# Patient Record
Sex: Male | Born: 1935 | Race: White | Hispanic: No | Marital: Married | Smoking: Former smoker
Health system: Southern US, Community
[De-identification: ages and names within clinical notes are randomized; demographics above are authoritative.]

## PROBLEM LIST (undated history)

## (undated) DIAGNOSIS — N400 Enlarged prostate without lower urinary tract symptoms: Secondary | ICD-10-CM

## (undated) DIAGNOSIS — I10 Essential (primary) hypertension: Secondary | ICD-10-CM

## (undated) DIAGNOSIS — F0390 Unspecified dementia without behavioral disturbance: Secondary | ICD-10-CM

## (undated) DIAGNOSIS — F32A Depression, unspecified: Secondary | ICD-10-CM

## (undated) DIAGNOSIS — F329 Major depressive disorder, single episode, unspecified: Secondary | ICD-10-CM

## (undated) DIAGNOSIS — E119 Type 2 diabetes mellitus without complications: Secondary | ICD-10-CM

---

## 1989-06-08 HISTORY — PX: CORONARY ARTERY BYPASS GRAFT: SHX141

## 2012-03-05 ENCOUNTER — Encounter (HOSPITAL_COMMUNITY): Payer: Self-pay | Admitting: Emergency Medicine

## 2012-03-05 ENCOUNTER — Emergency Department (HOSPITAL_COMMUNITY): Payer: Medicare Other

## 2012-03-05 ENCOUNTER — Emergency Department (HOSPITAL_COMMUNITY)
Admission: EM | Admit: 2012-03-05 | Discharge: 2012-03-05 | Disposition: A | Discharge status: Home / Self Care | Payer: Medicare Other | Attending: Emergency Medicine | Admitting: Emergency Medicine

## 2012-03-05 DIAGNOSIS — W19XXXA Unspecified fall, initial encounter: Secondary | ICD-10-CM

## 2012-03-05 DIAGNOSIS — F0390 Unspecified dementia without behavioral disturbance: Secondary | ICD-10-CM | POA: Insufficient documentation

## 2012-03-05 DIAGNOSIS — G319 Degenerative disease of nervous system, unspecified: Secondary | ICD-10-CM | POA: Insufficient documentation

## 2012-03-05 DIAGNOSIS — T148XXA Other injury of unspecified body region, initial encounter: Secondary | ICD-10-CM

## 2012-03-05 DIAGNOSIS — M25559 Pain in unspecified hip: Secondary | ICD-10-CM | POA: Insufficient documentation

## 2012-03-05 DIAGNOSIS — W010XXA Fall on same level from slipping, tripping and stumbling without subsequent striking against object, initial encounter: Secondary | ICD-10-CM | POA: Insufficient documentation

## 2012-03-05 DIAGNOSIS — Y9210 Unspecified residential institution as the place of occurrence of the external cause: Secondary | ICD-10-CM | POA: Insufficient documentation

## 2012-03-05 NOTE — ED Notes (Signed)
Pt arrived with EMS with and IV and I removed it before moving patient into [facility]

## 2012-03-05 NOTE — ED Notes (Signed)
Pt returned from CT °

## 2012-03-05 NOTE — ED Notes (Signed)
MD at bedside and removed pt off long spine board

## 2012-03-05 NOTE — ED Notes (Signed)
Pt is resident at [facility] at [place]. Pt was in dinning room getting breakfast when pt fell with all his weight on left hip. Pt has HX of dementia.

## 2012-03-05 NOTE — ED Notes (Signed)
Called report to [facility] at [facility]. Phoned ptar for transportation back to [facility]

## 2012-03-05 NOTE — ED Notes (Signed)
ptar is at bedside to transport pt to [facility]

## 2012-03-05 NOTE — ED Notes (Signed)
WUJ:[id number]<JY> Expected date:03/05/12<BR> Expected time: 9:38 AM<BR> Means of arrival:Ambulance<BR> Comments:<BR> Fall, hip rotation

## 2012-03-05 NOTE — ED Provider Notes (Signed)
History     CSN: [id number]  Arrival date & time 03/05/12  1004   First MD Initiated Contact with Patient 03/05/12 1007      Chief Complaint  Patient presents with  . Fall    (Consider location/radiation/quality/duration/timing/severity/associated sxs/prior treatment) Patient is a 78 y.o. male presenting with fall. The history is provided by the patient and the EMS personnel. The history is limited by the condition of the patient.  Fall  pt with hx dementia, very confused at baseline per report - level 5 caveat. Pt at [facility], was walking to breakfast, tripped, fell forward ?onto left hip, ? Hi head. No noted loc. ems was called. ems notes initial left hip pain, although states pt not able to verbalize pain. Was conscious and alert on their arrival and has remained alert, content, w mental status described as being consistent with baseline. No nv. Pt denies pain, although difficult to assess given advanced dementia/confusion at baseline.     No past medical history on file.  No past surgical history on file.  No family history on file.  History  Substance Use Topics  . Smoking status: Not on file  . Smokeless tobacco: Not on file  . Alcohol Use: Not on file      Review of Systems  Unable to perform ROS: Dementia  level 5 caveat  Allergies  Review of patient's allergies indicates not on file.  Home Medications  No current outpatient prescriptions on file.  BP 137/72  Pulse 63  Temp 97.3 F (36.3 C) (Axillary)  Resp 18  SpO2 95%  Physical Exam  Nursing note and vitals reviewed. Constitutional: He appears well-developed and well-nourished. No distress.  HENT:       Contusion anterior scalp.   Eyes: Pupils are equal, round, and reactive to light.  Neck: Neck supple. No tracheal deviation present.       ccollar  Cardiovascular: Normal rate, regular rhythm, normal heart sounds and intact distal pulses.   Pulmonary/Chest: Effort normal and breath sounds normal. No  accessory muscle usage. No respiratory distress. He exhibits no tenderness.  Abdominal: Soft. He exhibits no distension. There is no tenderness.  Musculoskeletal: Normal range of motion.       CTLS spine, non tender, aligned, no step off. ?tenderness left hip. Good rom, no gross shortening or rotation noted. Distal pulses palp bil.    Neurological: He is alert.       Awake and alert, confused. Moves bil extremities purposefully, non compliant w exam.   Skin: Skin is warm and dry.    ED Course  Procedures (including critical care time)  No results found for this or any previous visit. Dg Hip Complete Left  03/05/2012  *RADIOLOGY REPORT*  Clinical Data: Left hip pain.  Unable to give further history regarding trauma.  LEFT HIP - COMPLETE 2+ VIEW  Comparison: None  Findings: No acute fracture or dislocation.  Vascular calcifications. Sacroiliac joints are symmetric.  IMPRESSION: No acute osseous abnormality.   Original Report Authenticated By: Consuello Bossier, M.D.    Ct Head Wo Contrast  03/05/2012  *RADIOLOGY REPORT*  Clinical Data:  Fall  CT HEAD WITHOUT CONTRAST CT CERVICAL SPINE WITHOUT CONTRAST  Technique:  Multidetector CT imaging of the head and cervical spine was performed following the standard protocol without intravenous contrast.  Multiplanar CT image reconstructions of the cervical spine were also generated.  Comparison:  None.  CT HEAD  Findings: No evidence of parenchymal hemorrhage or extra-axial fluid  collection. No mass lesion, mass effect, or midline shift.  No CT evidence of acute infarction.  Subcortical white matter and periventricular small vessel ischemic changes.  Intracranial atherosclerosis.  Global cortical and central atrophy.  Secondary ventriculomegaly.  The visualized paranasal sinuses are essentially clear. The mastoid air cells are unopacified.  No evidence of calvarial fracture.  IMPRESSION: No evidence of acute intracranial abnormality.  Atrophy with secondary  ventriculomegaly.  Small vessel ischemic changes and intracranial atherosclerosis.  CT CERVICAL SPINE  Findings: Reversal of the normal cervical lordosis.  No evidence of fracture or dislocation.  Vertebral body heights are maintained.  The dens appears intact.  No prevertebral soft tissue swelling.  Mild to moderate degenerative changes, most prominent at C5-6.  Visualized thyroid is unremarkable.  Visualized lung apices are clear.  IMPRESSION: No evidence of traumatic injury to the cervical spine.  Mild to moderate degenerative changes.   Original Report Authenticated By: Charline Bills, M.D.    Ct Cervical Spine Wo Contrast  03/05/2012  *RADIOLOGY REPORT*  Clinical Data:  Fall  CT HEAD WITHOUT CONTRAST CT CERVICAL SPINE WITHOUT CONTRAST  Technique:  Multidetector CT imaging of the head and cervical spine was performed following the standard protocol without intravenous contrast.  Multiplanar CT image reconstructions of the cervical spine were also generated.  Comparison:  None.  CT HEAD  Findings: No evidence of parenchymal hemorrhage or extra-axial fluid collection. No mass lesion, mass effect, or midline shift.  No CT evidence of acute infarction.  Subcortical white matter and periventricular small vessel ischemic changes.  Intracranial atherosclerosis.  Global cortical and central atrophy.  Secondary ventriculomegaly.  The visualized paranasal sinuses are essentially clear. The mastoid air cells are unopacified.  No evidence of calvarial fracture.  IMPRESSION: No evidence of acute intracranial abnormality.  Atrophy with secondary ventriculomegaly.  Small vessel ischemic changes and intracranial atherosclerosis.  CT CERVICAL SPINE  Findings: Reversal of the normal cervical lordosis.  No evidence of fracture or dislocation.  Vertebral body heights are maintained.  The dens appears intact.  No prevertebral soft tissue swelling.  Mild to moderate degenerative changes, most prominent at C5-6.  Visualized  thyroid is unremarkable.  Visualized lung apices are clear.  IMPRESSION: No evidence of traumatic injury to the cervical spine.  Mild to moderate degenerative changes.   Original Report Authenticated By: Charline Bills, M.D.        MDM  Xrays.   Reviewed nursing notes and prior charts for additional history.    Recheck pt content, alert, comfortable. Good rom bil extremities without pain or focal bony tenderness.          Suzi Roots, MD 03/05/12 1210

## 2012-03-06 ENCOUNTER — Encounter (HOSPITAL_COMMUNITY): Payer: Self-pay | Admitting: Emergency Medicine

## 2012-03-06 ENCOUNTER — Emergency Department (HOSPITAL_COMMUNITY)
Admission: EM | Admit: 2012-03-06 | Discharge: 2012-03-06 | Disposition: A | Discharge status: Home / Self Care | Payer: Medicare Other | Attending: Emergency Medicine | Admitting: Emergency Medicine

## 2012-03-06 DIAGNOSIS — Z043 Encounter for examination and observation following other accident: Secondary | ICD-10-CM | POA: Insufficient documentation

## 2012-03-06 DIAGNOSIS — W19XXXA Unspecified fall, initial encounter: Secondary | ICD-10-CM

## 2012-03-06 DIAGNOSIS — F0390 Unspecified dementia without behavioral disturbance: Secondary | ICD-10-CM | POA: Insufficient documentation

## 2012-03-06 LAB — URINALYSIS, ROUTINE W REFLEX MICROSCOPIC
Nitrite: NEGATIVE
Protein, ur: NEGATIVE mg/dL
Specific Gravity, Urine: 1.027 (ref 1.005–1.030)
Urobilinogen, UA: 1 mg/dL (ref 0.0–1.0)

## 2012-03-06 LAB — BASIC METABOLIC PANEL
GFR calc Af Amer: >90 mL/min (ref >90)
GFR calc non Af Amer: 80 mL/min — ABNORMAL LOW (ref >90)
Potassium: 3.6 mEq/L (ref 3.5–5.1)
Sodium: 141 mEq/L (ref 135–145)

## 2012-03-06 LAB — URINE MICROSCOPIC-ADD ON

## 2012-03-06 NOTE — ED Provider Notes (Signed)
History     CSN: [id number]  Arrival date & time 03/06/12  0830   First MD Initiated Contact with Patient 03/06/12 [phone]      Chief Complaint  Patient presents with  . Fall    (Consider location/radiation/quality/duration/timing/severity/associated sxs/prior treatment) Patient is a 78 y.o. male presenting with fall. The history is provided by medical records and the nursing home.  Fall  PT  Has profound dementia. Lives in SNF.  Sent here for eval after fall. Seen in ed yesterday for same by dr. Arizona Constable.  Ct head, neck neg.  xr of hip neg.  No other hx available.  Level 5 caveat for profound dementia.  History reviewed. No pertinent past medical history.  History reviewed. No pertinent past surgical history.  No family history on file.  History  Substance Use Topics  . Smoking status: Not on file  . Smokeless tobacco: Not on file  . Alcohol Use: Not on file      Review of Systems  Unable to perform ROS: Dementia    Allergies  Review of patient's allergies indicates no known allergies.  Home Medications   Current Outpatient Rx  Name Route Sig Dispense Refill  . ASPIRIN EC 81 MG PO TBEC Oral Take 81 mg by mouth daily.    Marland Kitchen VITAMIN D 1000 UNITS PO TABS Oral Take 1,000 Units by mouth daily.    Marland Kitchen CLONAZEPAM 0.5 MG PO TABS Oral Take 0.5 mg by mouth daily.    Marland Kitchen DIVALPROEX SODIUM 125 MG PO CPSP Oral Take 375 mg by mouth 2 (two) times daily.    Marland Kitchen DOCUSATE SODIUM 100 MG PO CAPS Oral Take 100 mg by mouth daily.    Marland Kitchen FINASTERIDE 5 MG PO TABS Oral Take 5 mg by mouth daily.    . FUROSEMIDE 20 MG PO TABS Oral Take 20 mg by mouth daily.    . INSULIN ASPART 100 UNIT/ML [place] SOLN Subcutaneous Inject 2-10 Units into the skin 3 (three) times daily before meals.    . INSULIN DETEMIR 100 UNIT/ML [place] SOLN Subcutaneous Inject 35 Units into the skin at bedtime. Hold if CBG < 120.    Marland Kitchen INSULIN LISPRO (HUMAN) 100 UNIT/ML  SOLN Subcutaneous Inject 5 Units into the skin 3 (three) times daily  before meals.    Marland Kitchen LORAZEPAM 1 MG PO TABS Oral Take 2 mg by mouth every 6 (six) hours as needed. For anxiety.    Marland Kitchen METOPROLOL TARTRATE 50 MG PO TABS Oral Take 50 mg by mouth 2 (two) times daily.    . ADULT MULTIVITAMIN W/MINERALS CH Oral Take 1 tablet by mouth daily.    Marland Kitchen PRAVASTATIN SODIUM 40 MG PO TABS Oral Take 40 mg by mouth daily.    . QUETIAPINE FUMARATE 100 MG PO TABS Oral Take 100 mg by mouth daily.    . QUETIAPINE FUMARATE 50 MG PO TABS Oral Take 50 mg by mouth 2 (two) times daily.    Marland Kitchen RIVASTIGMINE 4.6 MG/24HR TD PT24 Transdermal Place 1 patch onto the skin daily.    Marland Kitchen TAMSULOSIN HCL 0.4 MG PO CAPS Oral Take 0.4 mg by mouth daily.    . TRAZODONE HCL 50 MG PO TABS Oral Take 25 mg by mouth at bedtime.    Marland Kitchen VITAMIN B-12 1000 MCG PO TABS Oral Take 1,000 mcg by mouth daily.      BP 103/69  Pulse 65  Temp 99 F (37.2 C) (Oral)  Resp 14  SpO2 97%  Physical  Exam  Vitals reviewed. Constitutional: He appears well-developed and well-nourished. No distress.  HENT:  Head: Normocephalic and atraumatic.  Eyes: Conjunctivae normal are normal.  Neck: Normal range of motion. Neck supple. No tracheal deviation present.       No neck ttp  Cardiovascular: Normal rate, regular rhythm and intact distal pulses.   No murmur heard. Pulmonary/Chest: Effort normal and breath sounds normal. He exhibits no tenderness.  Abdominal: Soft. Bowel sounds are normal. There is no tenderness.  Musculoskeletal: Normal range of motion. He exhibits no edema and no tenderness.       No deformities. rom does not cause pain.  No bruises.   Neurological: He is alert.  Skin: Skin is warm and dry. No erythema.    ED Course  Procedures (including critical care time)  Labs Reviewed  BASIC METABOLIC PANEL - Abnormal; Notable for the following:    Glucose, Bld 151 (*)     Calcium 8.3 (*)     GFR calc non Af Amer 80 (*)     All other components within normal limits  URINALYSIS, ROUTINE W REFLEX MICROSCOPIC    Dg Hip Complete Left  03/05/2012  *RADIOLOGY REPORT*  Clinical Data: Left hip pain.  Unable to give further history regarding trauma.  LEFT HIP - COMPLETE 2+ VIEW  Comparison: None  Findings: No acute fracture or dislocation.  Vascular calcifications. Sacroiliac joints are symmetric.  IMPRESSION: No acute osseous abnormality.   Original Report Authenticated By: Consuello Bossier, M.D.    Ct Head Wo Contrast  03/05/2012  *RADIOLOGY REPORT*  Clinical Data:  Fall  CT HEAD WITHOUT CONTRAST CT CERVICAL SPINE WITHOUT CONTRAST  Technique:  Multidetector CT imaging of the head and cervical spine was performed following the standard protocol without intravenous contrast.  Multiplanar CT image reconstructions of the cervical spine were also generated.  Comparison:  None.  CT HEAD  Findings: No evidence of parenchymal hemorrhage or extra-axial fluid collection. No mass lesion, mass effect, or midline shift.  No CT evidence of acute infarction.  Subcortical white matter and periventricular small vessel ischemic changes.  Intracranial atherosclerosis.  Global cortical and central atrophy.  Secondary ventriculomegaly.  The visualized paranasal sinuses are essentially clear. The mastoid air cells are unopacified.  No evidence of calvarial fracture.  IMPRESSION: No evidence of acute intracranial abnormality.  Atrophy with secondary ventriculomegaly.  Small vessel ischemic changes and intracranial atherosclerosis.  CT CERVICAL SPINE  Findings: Reversal of the normal cervical lordosis.  No evidence of fracture or dislocation.  Vertebral body heights are maintained.  The dens appears intact.  No prevertebral soft tissue swelling.  Mild to moderate degenerative changes, most prominent at C5-6.  Visualized thyroid is unremarkable.  Visualized lung apices are clear.  IMPRESSION: No evidence of traumatic injury to the cervical spine.  Mild to moderate degenerative changes.   Original Report Authenticated By: Charline Bills, M.D.     Ct Cervical Spine Wo Contrast  03/05/2012  *RADIOLOGY REPORT*  Clinical Data:  Fall  CT HEAD WITHOUT CONTRAST CT CERVICAL SPINE WITHOUT CONTRAST  Technique:  Multidetector CT imaging of the head and cervical spine was performed following the standard protocol without intravenous contrast.  Multiplanar CT image reconstructions of the cervical spine were also generated.  Comparison:  None.  CT HEAD  Findings: No evidence of parenchymal hemorrhage or extra-axial fluid collection. No mass lesion, mass effect, or midline shift.  No CT evidence of acute infarction.  Subcortical white matter and periventricular small vessel  ischemic changes.  Intracranial atherosclerosis.  Global cortical and central atrophy.  Secondary ventriculomegaly.  The visualized paranasal sinuses are essentially clear. The mastoid air cells are unopacified.  No evidence of calvarial fracture.  IMPRESSION: No evidence of acute intracranial abnormality.  Atrophy with secondary ventriculomegaly.  Small vessel ischemic changes and intracranial atherosclerosis.  CT CERVICAL SPINE  Findings: Reversal of the normal cervical lordosis.  No evidence of fracture or dislocation.  Vertebral body heights are maintained.  The dens appears intact.  No prevertebral soft tissue swelling.  Mild to moderate degenerative changes, most prominent at C5-6.  Visualized thyroid is unremarkable.  Visualized lung apices are clear.  IMPRESSION: No evidence of traumatic injury to the cervical spine.  Mild to moderate degenerative changes.   Original Report Authenticated By: Charline Bills, M.D.      No diagnosis found.    MDM  Dementia Reported fall. NO EVIDENCE OF INJURY        Cheri Guppy, MD 03/06/12 1021

## 2012-03-06 NOTE — ED Notes (Signed)
UJW:[id number]<YN> Expected date:03/06/12<BR> Expected time: 8:30 AM<BR> Means of arrival:Ambulance<BR> Comments:<BR> Dementia, rolled out of bed

## 2012-03-06 NOTE — ED Notes (Addendum)
Pt in via [facility]. Per EMS, pt rolled out of bed, Pt was here yesterday for the same.Pt sent per nursing facility policy for fall check. Pt verbalizes pain when moved in bed. Pt alert but not oriented to self, time and place.

## 2012-03-07 ENCOUNTER — Emergency Department (HOSPITAL_COMMUNITY)
Admission: EM | Admit: 2012-03-07 | Discharge: 2012-03-07 | Disposition: A | Discharge status: Skilled Nursing Facility | Payer: Medicare Other | Attending: Emergency Medicine | Admitting: Emergency Medicine

## 2012-03-07 DIAGNOSIS — R4701 Aphasia: Secondary | ICD-10-CM | POA: Insufficient documentation

## 2012-03-07 DIAGNOSIS — Z794 Long term (current) use of insulin: Secondary | ICD-10-CM | POA: Insufficient documentation

## 2012-03-07 DIAGNOSIS — F0390 Unspecified dementia without behavioral disturbance: Secondary | ICD-10-CM | POA: Insufficient documentation

## 2012-03-07 DIAGNOSIS — Z993 Dependence on wheelchair: Secondary | ICD-10-CM | POA: Insufficient documentation

## 2012-03-07 DIAGNOSIS — S0003XA Contusion of scalp, initial encounter: Secondary | ICD-10-CM | POA: Insufficient documentation

## 2012-03-07 DIAGNOSIS — W19XXXA Unspecified fall, initial encounter: Secondary | ICD-10-CM

## 2012-03-07 DIAGNOSIS — W050XXA Fall from non-moving wheelchair, initial encounter: Secondary | ICD-10-CM | POA: Insufficient documentation

## 2012-03-07 NOTE — ED Notes (Signed)
PTAR called  

## 2012-03-07 NOTE — ED Notes (Signed)
Received pt in room 25 , pt here from [facility] nursing facility where staff reported he fell out of the wheelchair. Per EMS this is his 5th floor in the last 48 hours. Pt was supposed to get a helmet at nursing facility, however he fell prior to receiving one. Pt is only responding to touch with grunting noises. Per EMS pt 's baseline mental status is unknown due to pt being on numerous psych. Medications. Pt has abrasion on his forehead and small abrasion on his right knee.

## 2012-03-07 NOTE — ED Provider Notes (Addendum)
History     CSN: [id number]  Arrival date & time 03/07/12  1548   First MD Initiated Contact with Patient 03/07/12 1658      Chief Complaint  Patient presents with  . Fall     HPI This elderly male presents for the third time in 3 days following a fall.  The patient has baseline significant dementia, is aphasic, nonambulatory.  Today, the patient slipped from his wheelchair, and was brought here for evaluation.  No reports of loss of consciousness, vomiting, behavioral changes, no notable other events following a fall. The patient was scheduled to receive a helmet due to his high fall risk.  He had not yet received the home but today when he fell.  Level V caveat, dementia No past medical history on file.  No past surgical history on file.  No family history on file.  History  Substance Use Topics  . Smoking status: Not on file  . Smokeless tobacco: Not on file  . Alcohol Use: Not on file      Review of Systems  Unable to perform ROS: Dementia    Allergies  Review of patient's allergies indicates no known allergies.  Home Medications   Current Outpatient Rx  Name Route Sig Dispense Refill  . ASPIRIN EC 81 MG PO TBEC Oral Take 81 mg by mouth daily.    Marland Kitchen VITAMIN D 1000 UNITS PO TABS Oral Take 1,000 Units by mouth daily.    Marland Kitchen CLONAZEPAM 0.5 MG PO TABS Oral Take 0.5 mg by mouth daily.    Marland Kitchen DIVALPROEX SODIUM 125 MG PO CPSP Oral Take 375 mg by mouth 2 (two) times daily.    Marland Kitchen DOCUSATE SODIUM 100 MG PO CAPS Oral Take 100 mg by mouth daily.    Marland Kitchen FINASTERIDE 5 MG PO TABS Oral Take 5 mg by mouth daily.    . FUROSEMIDE 20 MG PO TABS Oral Take 20 mg by mouth daily.    . INSULIN ASPART 100 UNIT/ML [place] SOLN Subcutaneous Inject 2-10 Units into the skin 3 (three) times daily before meals.    . INSULIN DETEMIR 100 UNIT/ML [place] SOLN Subcutaneous Inject 35 Units into the skin at bedtime. Hold if CBG < 120.    Marland Kitchen INSULIN LISPRO (HUMAN) 100 UNIT/ML [place] SOLN Subcutaneous Inject 5 Units into  the skin 3 (three) times daily before meals.    Marland Kitchen LORAZEPAM 1 MG PO TABS Oral Take 2 mg by mouth every 6 (six) hours as needed. For anxiety.    Marland Kitchen METOPROLOL TARTRATE 50 MG PO TABS Oral Take 50 mg by mouth 2 (two) times daily.    . ADULT MULTIVITAMIN W/MINERALS CH Oral Take 1 tablet by mouth daily.    Marland Kitchen PRAVASTATIN SODIUM 40 MG PO TABS Oral Take 40 mg by mouth daily.    . QUETIAPINE FUMARATE 100 MG PO TABS Oral Take 100 mg by mouth daily.    . QUETIAPINE FUMARATE 50 MG PO TABS Oral Take 50 mg by mouth 2 (two) times daily.    Marland Kitchen RIVASTIGMINE 4.6 MG/24HR TD PT24 Transdermal Place 1 patch onto the skin daily.    Marland Kitchen TAMSULOSIN HCL 0.4 MG PO CAPS Oral Take 0.4 mg by mouth daily.    . TRAZODONE HCL 50 MG PO TABS Oral Take 25 mg by mouth at bedtime.    Marland Kitchen VITAMIN B-12 1000 MCG PO TABS Oral Take 1,000 mcg by mouth daily.      BP 136/86  Pulse 60  Temp  97.8 F (36.6 C) (Axillary)  Resp 16  SpO2 97%  Physical Exam  Nursing note and vitals reviewed. Constitutional: He appears well-developed and well-nourished. No distress.  HENT:       Contusion anterior scalp.   Eyes: Pupils are equal, round, and reactive to light.  Neck: Neck supple. No tracheal deviation present.       ccollar  Cardiovascular: Normal rate, regular rhythm, normal heart sounds and intact distal pulses.   Pulmonary/Chest: Effort normal and breath sounds normal. No accessory muscle usage. No respiratory distress. He exhibits no tenderness.  Abdominal: Soft. He exhibits no distension. There is no tenderness.  Musculoskeletal: Normal range of motion.       CTLS spine, non tender, aligned, no step off. ?tenderness left hip. Good rom, no gross shortening or rotation noted. Distal pulses palp bil.    Neurological: He is alert.       Awake and alert, confused. Moves bil extremities purposefully, non compliant w exam.   Skin: Skin is warm and dry.    ED Course  Procedures (including critical care time)  Labs Reviewed - No data to  display No results found.   No diagnosis found.  Following his initial presentation, after a period with no new complaints, the patient's collar was removed.  He moved his neck freely, and gave no indication of discomfort.  MDM  This elderly male with advanced dementia presents for the third time in 3 days after a fall.  On exam he is in no distress, though he is not cooperative with the exam.  There no focal new traumatic findings.  The patient's vital signs are stable.  The patient is not anticoagulated, and given the description of significant mechanism of injury there is low suspicion for acute intracranial hemorrhage.  Given the recent imaging the patient will not have additional CAT scans.  He was discharged in stable condition, though with noted found dementia, to his nursing home.   Gerhard Munch, MD 03/07/12 1733  Gerhard Munch, MD 03/07/12 [phone]

## 2012-04-01 ENCOUNTER — Encounter (HOSPITAL_COMMUNITY): Payer: Self-pay | Admitting: Emergency Medicine

## 2012-04-01 ENCOUNTER — Emergency Department (HOSPITAL_COMMUNITY)
Admission: EM | Admit: 2012-04-01 | Discharge: 2012-04-01 | Disposition: A | Discharge status: Home / Self Care | Payer: Medicare Other | Attending: Emergency Medicine | Admitting: Emergency Medicine

## 2012-04-01 ENCOUNTER — Emergency Department (HOSPITAL_COMMUNITY): Payer: Medicare Other

## 2012-04-01 DIAGNOSIS — Z79899 Other long term (current) drug therapy: Secondary | ICD-10-CM | POA: Insufficient documentation

## 2012-04-01 DIAGNOSIS — I10 Essential (primary) hypertension: Secondary | ICD-10-CM | POA: Insufficient documentation

## 2012-04-01 DIAGNOSIS — Z951 Presence of aortocoronary bypass graft: Secondary | ICD-10-CM | POA: Insufficient documentation

## 2012-04-01 DIAGNOSIS — E119 Type 2 diabetes mellitus without complications: Secondary | ICD-10-CM | POA: Insufficient documentation

## 2012-04-01 DIAGNOSIS — Z7982 Long term (current) use of aspirin: Secondary | ICD-10-CM | POA: Insufficient documentation

## 2012-04-01 DIAGNOSIS — R1110 Vomiting, unspecified: Secondary | ICD-10-CM | POA: Insufficient documentation

## 2012-04-01 DIAGNOSIS — R4182 Altered mental status, unspecified: Secondary | ICD-10-CM

## 2012-04-01 DIAGNOSIS — F0390 Unspecified dementia without behavioral disturbance: Secondary | ICD-10-CM | POA: Insufficient documentation

## 2012-04-01 DIAGNOSIS — N400 Enlarged prostate without lower urinary tract symptoms: Secondary | ICD-10-CM | POA: Insufficient documentation

## 2012-04-01 DIAGNOSIS — Z794 Long term (current) use of insulin: Secondary | ICD-10-CM | POA: Insufficient documentation

## 2012-04-01 HISTORY — DX: Benign prostatic hyperplasia without lower urinary tract symptoms: N40.0

## 2012-04-01 HISTORY — DX: Unspecified dementia, unspecified severity, without behavioral disturbance, psychotic disturbance, mood disturbance, and anxiety: F03.90

## 2012-04-01 HISTORY — DX: Type 2 diabetes mellitus without complications: E11.9

## 2012-04-01 HISTORY — DX: Essential (primary) hypertension: I10

## 2012-04-01 LAB — VALPROIC ACID LEVEL: Valproic Acid Lvl: 23.5 ug/mL — ABNORMAL LOW (ref 50.0–100.0)

## 2012-04-01 LAB — URINALYSIS, ROUTINE W REFLEX MICROSCOPIC
Glucose, UA: NEGATIVE mg/dL
Leukocytes, UA: NEGATIVE
Nitrite: NEGATIVE
Specific Gravity, Urine: 1.024 (ref 1.005–1.030)
pH: 5.5 (ref 5.0–8.0)

## 2012-04-01 LAB — CBC
HCT: 42.2 % (ref 39.0–52.0)
Hemoglobin: 14 g/dL (ref 13.0–17.0)
MCH: 29.8 pg (ref 26.0–34.0)
MCHC: 33.2 g/dL (ref 30.0–36.0)
RDW: 13.8 % (ref 11.5–15.5)

## 2012-04-01 LAB — COMPREHENSIVE METABOLIC PANEL
Albumin: 2.4 g/dL — ABNORMAL LOW (ref 3.5–5.2)
BUN: 32 mg/dL — ABNORMAL HIGH (ref 6–23)
Calcium: 8.6 mg/dL (ref 8.4–10.5)
GFR calc Af Amer: 78 mL/min — ABNORMAL LOW (ref >90)
Glucose, Bld: 203 mg/dL — ABNORMAL HIGH (ref 70–99)
Potassium: 4.6 mEq/L (ref 3.5–5.1)
Total Protein: 7.1 g/dL (ref 6.0–8.3)

## 2012-04-01 LAB — OCCULT BLOOD, POC DEVICE: Fecal Occult Bld: POSITIVE

## 2012-04-01 NOTE — ED Notes (Signed)
Family at bedside. 

## 2012-04-01 NOTE — ED Provider Notes (Signed)
History     CSN: [id number]  Arrival date & time 04/01/12  1227   First MD Initiated Contact with Patient 04/01/12 1244      Chief Complaint  Patient presents with  . Altered Mental Status   level V caveat, dementia. History is obtained from patient's wife who accompanies him (Consider location/radiation/quality/duration/timing/severity/associated sxs/prior treatment) HPI Patient was extremely sleepy and difficult to arouse approximately 11 AM today. He vomited one time. Since vomiting he is become more awake, now acting at baseline. No treatment prior to coming here. Past Medical History  Diagnosis Date  . Hypertension   . Diabetes mellitus without complication   . Dementia   . BPH (benign prostatic hyperplasia)     History reviewed. No pertinent past surgical history. CABG History reviewed. No pertinent family history.  History  Substance Use Topics  . Smoking status: Not on file  . Smokeless tobacco: Not on file  . Alcohol Use:       Review of Systems  Unable to perform ROS: Dementia  Constitutional: Negative.   HENT: Negative.   Respiratory: Negative.   Cardiovascular: Negative.   Gastrointestinal: Negative.   Musculoskeletal: Negative.   Skin: Negative.   Neurological: Negative.   Hematological: Negative.   Psychiatric/Behavioral: Negative.     Allergies  Review of patient's allergies indicates no known allergies.  Home Medications   Current Outpatient Rx  Name Route Sig Dispense Refill  . ASPIRIN EC 81 MG PO TBEC Oral Take 81 mg by mouth daily.    Marland Kitchen VITAMIN D 1000 UNITS PO TABS Oral Take 1,000 Units by mouth daily.    Marland Kitchen CLONAZEPAM 0.5 MG PO TABS Oral Take 0.5 mg by mouth daily.    Marland Kitchen DIVALPROEX SODIUM 125 MG PO CPSP Oral Take 375 mg by mouth 2 (two) times daily.    Marland Kitchen DOCUSATE SODIUM 100 MG PO CAPS Oral Take 100 mg by mouth daily.    Marland Kitchen FINASTERIDE 5 MG PO TABS Oral Take 5 mg by mouth daily.    . FUROSEMIDE 20 MG PO TABS Oral Take 20 mg by mouth  daily.    . INSULIN ASPART 100 UNIT/ML [place] SOLN Subcutaneous Inject 2-10 Units into the skin 3 (three) times daily before meals.    . INSULIN DETEMIR 100 UNIT/ML [place] SOLN Subcutaneous Inject 35 Units into the skin at bedtime. Hold if CBG < 120.    Marland Kitchen INSULIN LISPRO (HUMAN) 100 UNIT/ML [place] SOLN Subcutaneous Inject 5 Units into the skin 3 (three) times daily before meals.    Marland Kitchen LORAZEPAM 1 MG PO TABS Oral Take 2 mg by mouth every 6 (six) hours as needed. For anxiety.    Marland Kitchen METOPROLOL TARTRATE 50 MG PO TABS Oral Take 50 mg by mouth 2 (two) times daily.    . ADULT MULTIVITAMIN W/MINERALS CH Oral Take 1 tablet by mouth daily.    Marland Kitchen PRAVASTATIN SODIUM 40 MG PO TABS Oral Take 40 mg by mouth daily.    . QUETIAPINE FUMARATE 100 MG PO TABS Oral Take 100 mg by mouth daily.    . QUETIAPINE FUMARATE 50 MG PO TABS Oral Take 50 mg by mouth 2 (two) times daily.    Marland Kitchen RIVASTIGMINE 4.6 MG/24HR TD PT24 Transdermal Place 1 patch onto the skin daily.    Marland Kitchen TAMSULOSIN HCL 0.4 MG PO CAPS Oral Take 0.4 mg by mouth daily.    . TRAZODONE HCL 50 MG PO TABS Oral Take 25 mg by mouth at bedtime.    Marland Kitchen  VITAMIN B-12 1000 MCG PO TABS Oral Take 1,000 mcg by mouth daily.      Temp 98.1 F (36.7 C) (Oral)  SpO2 92%  Physical Exam  Nursing note and vitals reviewed. Constitutional: He appears well-developed and well-nourished.       Chronically ill-appearing  HENT:  Head: Normocephalic and atraumatic.  Eyes: Conjunctivae normal are normal. Pupils are equal, round, and reactive to light.  Neck: Neck supple. No tracheal deviation present. No thyromegaly present.  Cardiovascular: Normal rate and regular rhythm.   No murmur heard. Pulmonary/Chest: Effort normal and breath sounds normal.  Abdominal: Soft. Bowel sounds are normal. He exhibits no distension. There is no tenderness.  Genitourinary:       Brown stool nontender trace Hemoccult positive  Musculoskeletal: Normal range of motion. He exhibits no edema and no tenderness.    Neurological: He is alert.       Moves all extremities follow some simple commands  Skin: Skin is warm and dry. No rash noted.       Superficial silver dollar sized decubitus ulcer the presacral area  Psychiatric:       Presently confused    ED Course  Procedures (including critical care time)  Date: 04/01/2012  Rate: 70  Rhythm: normal sinus rhythm  QRS Axis: normal  Intervals: normal  ST/T Wave abnormalities: nonspecific T wave changes  Conduction Disutrbances:none  Narrative Interpretation:   Old EKG Reviewed: none available   Labs Reviewed  CBC  OCCULT BLOOD, POC DEVICE  COMPREHENSIVE METABOLIC PANEL  URINALYSIS, ROUTINE W REFLEX MICROSCOPIC   No results found.  Results for orders placed during the hospital encounter of 04/01/12  CBC      Component Value Range   WBC 8.4  4.0 - 10.5 K/uL   RBC 4.70  4.22 - 5.81 MIL/uL   Hemoglobin 14.0  13.0 - 17.0 g/dL   HCT 16.1  09.6 - 04.5 %   MCV 89.8  78.0 - 100.0 fL   MCH 29.8  26.0 - 34.0 pg   MCHC 33.2  30.0 - 36.0 g/dL   RDW 40.9  81.1 - 91.4 %   Platelets 258  150 - 400 K/uL  COMPREHENSIVE METABOLIC PANEL      Component Value Range   Sodium 148 (*) 135 - 145 mEq/L   Potassium 4.6  3.5 - 5.1 mEq/L   Chloride 111  96 - 112 mEq/L   CO2 25  19 - 32 mEq/L   Glucose, Bld 203 (*) 70 - 99 mg/dL   BUN 32 (*) 6 - 23 mg/dL   Creatinine, Ser 7.82  0.50 - 1.35 mg/dL   Calcium 8.6  8.4 - 95.6 mg/dL   Total Protein 7.1  6.0 - 8.3 g/dL   Albumin 2.4 (*) 3.5 - 5.2 g/dL   AST 46 (*) 0 - 37 U/L   ALT 28  0 - 53 U/L   Alkaline Phosphatase 64  39 - 117 U/L   Total Bilirubin 0.3  0.3 - 1.2 mg/dL   GFR calc non Af Amer 68 (*) >90 mL/min   GFR calc Af Amer 78 (*) >90 mL/min  URINALYSIS, ROUTINE W REFLEX MICROSCOPIC      Component Value Range   Color, Urine YELLOW  YELLOW   APPearance CLEAR  CLEAR   Specific Gravity, Urine 1.024  1.005 - 1.030   pH 5.5  5.0 - 8.0   Glucose, UA NEGATIVE  NEGATIVE mg/dL   Hgb urine dipstick  NEGATIVE  NEGATIVE   Bilirubin Urine SMALL (*) NEGATIVE   Ketones, ur 15 (*) NEGATIVE mg/dL   Protein, ur NEGATIVE  NEGATIVE mg/dL   Urobilinogen, UA 1.0  0.0 - 1.0 mg/dL   Nitrite NEGATIVE  NEGATIVE   Leukocytes, UA NEGATIVE  NEGATIVE  OCCULT BLOOD, POC DEVICE      Component Value Range   Fecal Occult Bld POSITIVE    VALPROIC ACID LEVEL      Component Value Range   Valproic Acid Lvl 23.5 (*) 50.0 - 100.0 ug/mL   Dg Hip Complete Left  03/05/2012  *RADIOLOGY REPORT*  Clinical Data: Left hip pain.  Unable to give further history regarding trauma.  LEFT HIP - COMPLETE 2+ VIEW  Comparison: None  Findings: No acute fracture or dislocation.  Vascular calcifications. Sacroiliac joints are symmetric.  IMPRESSION: No acute osseous abnormality.   Original Report Authenticated By: Consuello Bossier, M.D.    Ct Head Wo Contrast  04/01/2012  *RADIOLOGY REPORT*  Clinical Data: Pain.  CT HEAD WITHOUT CONTRAST  Technique:  Contiguous axial images were obtained from the base of the skull through the vertex without contrast.  Comparison: Head CT scan 03/05/2012.  Findings: There is some chronic microvascular ischemic change and cortical atrophy.  No evidence of acute abnormality including infarct, hemorrhage, mass lesion, mass effect, midline shift or abnormal extra-axial fluid collection.  No hydrocephalus or pneumocephalus.  The calvarium is intact.  Atherosclerosis is noted.  IMPRESSION: No acute finding.  Stable compared to prior exam.   Original Report Authenticated By: Bernadene Bell. Maricela Curet, M.D.    Ct Head Wo Contrast  03/05/2012  *RADIOLOGY REPORT*  Clinical Data:  Fall  CT HEAD WITHOUT CONTRAST CT CERVICAL SPINE WITHOUT CONTRAST  Technique:  Multidetector CT imaging of the head and cervical spine was performed following the standard protocol without intravenous contrast.  Multiplanar CT image reconstructions of the cervical spine were also generated.  Comparison:  None.  CT HEAD  Findings: No evidence of  parenchymal hemorrhage or extra-axial fluid collection. No mass lesion, mass effect, or midline shift.  No CT evidence of acute infarction.  Subcortical white matter and periventricular small vessel ischemic changes.  Intracranial atherosclerosis.  Global cortical and central atrophy.  Secondary ventriculomegaly.  The visualized paranasal sinuses are essentially clear. The mastoid air cells are unopacified.  No evidence of calvarial fracture.  IMPRESSION: No evidence of acute intracranial abnormality.  Atrophy with secondary ventriculomegaly.  Small vessel ischemic changes and intracranial atherosclerosis.  CT CERVICAL SPINE  Findings: Reversal of the normal cervical lordosis.  No evidence of fracture or dislocation.  Vertebral body heights are maintained.  The dens appears intact.  No prevertebral soft tissue swelling.  Mild to moderate degenerative changes, most prominent at C5-6.  Visualized thyroid is unremarkable.  Visualized lung apices are clear.  IMPRESSION: No evidence of traumatic injury to the cervical spine.  Mild to moderate degenerative changes.   Original Report Authenticated By: Charline Bills, M.D.    Ct Cervical Spine Wo Contrast  03/05/2012  *RADIOLOGY REPORT*  Clinical Data:  Fall  CT HEAD WITHOUT CONTRAST CT CERVICAL SPINE WITHOUT CONTRAST  Technique:  Multidetector CT imaging of the head and cervical spine was performed following the standard protocol without intravenous contrast.  Multiplanar CT image reconstructions of the cervical spine were also generated.  Comparison:  None.  CT HEAD  Findings: No evidence of parenchymal hemorrhage or extra-axial fluid collection. No mass lesion, mass effect, or midline shift.  No CT evidence  of acute infarction.  Subcortical white matter and periventricular small vessel ischemic changes.  Intracranial atherosclerosis.  Global cortical and central atrophy.  Secondary ventriculomegaly.  The visualized paranasal sinuses are essentially clear. The  mastoid air cells are unopacified.  No evidence of calvarial fracture.  IMPRESSION: No evidence of acute intracranial abnormality.  Atrophy with secondary ventriculomegaly.  Small vessel ischemic changes and intracranial atherosclerosis.  CT CERVICAL SPINE  Findings: Reversal of the normal cervical lordosis.  No evidence of fracture or dislocation.  Vertebral body heights are maintained.  The dens appears intact.  No prevertebral soft tissue swelling.  Mild to moderate degenerative changes, most prominent at C5-6.  Visualized thyroid is unremarkable.  Visualized lung apices are clear.  IMPRESSION: No evidence of traumatic injury to the cervical spine.  Mild to moderate degenerative changes.   Original Report Authenticated By: Charline Bills, M.D.     No diagnosis found.    MDM  Transfer to CDU pending ED work-up . Pt has returned to baseline mental status @ time of transfer to CDU .         Doug Sou, MD 04/01/12 2050

## 2012-04-01 NOTE — ED Notes (Signed)
Patient was discharge by [facility] back to [facility].  Discharge instructions and [facility] transfer paperwork was printed and given to them.

## 2012-04-01 NOTE — ED Provider Notes (Signed)
Patient in CDU awaiting completion of diagnostic testing in the evaluation of altered mental status.   Lab and radiology results reviewed, discussed with Dr.Jjacubowitz and shared with patient's family.  Depakote level sub-therapeutic, however patient is taking a low dose for agitation control per his psychiatrist.  Dosage was recently reduced from 375 mg bid to 500 mg qhs due to lethargy.  Patient currently awake, answers questions appropriately.  Wife reports pateint is back to his baseline.  Patient to be discharged back to his nursing facility with follow-up as needed.  Jimmye Norman, NP 04/01/12 1700

## 2012-04-01 NOTE — ED Notes (Signed)
Per EMS pt was brought from facility because he has some confusion, more than usual, N/V. Staff said he wasn't his usual self, he usually talks but is not talking today.

## 2012-04-01 NOTE — ED Provider Notes (Signed)
Medical screening examination/treatment/procedure(s) were conducted as a shared visit with non-physician practitioner(s) and myself.  I personally evaluated the patient during the encounter  Doug Sou, MD 04/01/12 2051

## 2012-04-01 NOTE — ED Notes (Signed)
Patient is resting comfortably. 

## 2012-04-06 ENCOUNTER — Emergency Department (HOSPITAL_COMMUNITY): Payer: Medicare Other

## 2012-04-06 ENCOUNTER — Inpatient Hospital Stay (HOSPITAL_COMMUNITY): Payer: Medicare Other

## 2012-04-06 ENCOUNTER — Inpatient Hospital Stay (HOSPITAL_COMMUNITY)
Admission: EM | Admit: 2012-04-06 | Discharge: 2012-04-15 | DRG: 884 | Disposition: A | Discharge status: Skilled Nursing Facility | Payer: Medicare Other | Attending: Internal Medicine | Admitting: Internal Medicine

## 2012-04-06 DIAGNOSIS — E1165 Type 2 diabetes mellitus with hyperglycemia: Secondary | ICD-10-CM | POA: Diagnosis present

## 2012-04-06 DIAGNOSIS — E119 Type 2 diabetes mellitus without complications: Secondary | ICD-10-CM

## 2012-04-06 DIAGNOSIS — R509 Fever, unspecified: Secondary | ICD-10-CM | POA: Diagnosis present

## 2012-04-06 DIAGNOSIS — E876 Hypokalemia: Secondary | ICD-10-CM

## 2012-04-06 DIAGNOSIS — R4182 Altered mental status, unspecified: Secondary | ICD-10-CM | POA: Diagnosis present

## 2012-04-06 DIAGNOSIS — K5289 Other specified noninfective gastroenteritis and colitis: Secondary | ICD-10-CM | POA: Diagnosis not present

## 2012-04-06 DIAGNOSIS — J9600 Acute respiratory failure, unspecified whether with hypoxia or hypercapnia: Secondary | ICD-10-CM | POA: Diagnosis not present

## 2012-04-06 DIAGNOSIS — IMO0002 Reserved for concepts with insufficient information to code with codable children: Secondary | ICD-10-CM

## 2012-04-06 DIAGNOSIS — Z7982 Long term (current) use of aspirin: Secondary | ICD-10-CM

## 2012-04-06 DIAGNOSIS — R001 Bradycardia, unspecified: Secondary | ICD-10-CM

## 2012-04-06 DIAGNOSIS — Z66 Do not resuscitate: Secondary | ICD-10-CM | POA: Diagnosis present

## 2012-04-06 DIAGNOSIS — L8995 Pressure ulcer of unspecified site, unstageable: Secondary | ICD-10-CM | POA: Diagnosis present

## 2012-04-06 DIAGNOSIS — D72829 Elevated white blood cell count, unspecified: Secondary | ICD-10-CM

## 2012-04-06 DIAGNOSIS — IMO0001 Reserved for inherently not codable concepts without codable children: Secondary | ICD-10-CM | POA: Diagnosis present

## 2012-04-06 DIAGNOSIS — Z87891 Personal history of nicotine dependence: Secondary | ICD-10-CM

## 2012-04-06 DIAGNOSIS — F0390 Unspecified dementia without behavioral disturbance: Principal | ICD-10-CM

## 2012-04-06 DIAGNOSIS — R291 Meningismus: Secondary | ICD-10-CM

## 2012-04-06 DIAGNOSIS — N179 Acute kidney failure, unspecified: Secondary | ICD-10-CM

## 2012-04-06 DIAGNOSIS — M353 Polymyalgia rheumatica: Secondary | ICD-10-CM | POA: Diagnosis present

## 2012-04-06 DIAGNOSIS — I498 Other specified cardiac arrhythmias: Secondary | ICD-10-CM | POA: Diagnosis present

## 2012-04-06 DIAGNOSIS — R7881 Bacteremia: Secondary | ICD-10-CM | POA: Diagnosis present

## 2012-04-06 DIAGNOSIS — J9601 Acute respiratory failure with hypoxia: Secondary | ICD-10-CM

## 2012-04-06 DIAGNOSIS — L89150 Pressure ulcer of sacral region, unstageable: Secondary | ICD-10-CM

## 2012-04-06 DIAGNOSIS — E860 Dehydration: Secondary | ICD-10-CM

## 2012-04-06 DIAGNOSIS — Z79899 Other long term (current) drug therapy: Secondary | ICD-10-CM

## 2012-04-06 DIAGNOSIS — Z794 Long term (current) use of insulin: Secondary | ICD-10-CM

## 2012-04-06 DIAGNOSIS — I10 Essential (primary) hypertension: Secondary | ICD-10-CM

## 2012-04-06 DIAGNOSIS — L89109 Pressure ulcer of unspecified part of back, unspecified stage: Secondary | ICD-10-CM | POA: Diagnosis present

## 2012-04-06 DIAGNOSIS — R933 Abnormal findings on diagnostic imaging of other parts of digestive tract: Secondary | ICD-10-CM

## 2012-04-06 DIAGNOSIS — N400 Enlarged prostate without lower urinary tract symptoms: Secondary | ICD-10-CM | POA: Diagnosis present

## 2012-04-06 DIAGNOSIS — E870 Hyperosmolality and hypernatremia: Secondary | ICD-10-CM

## 2012-04-06 LAB — GLUCOSE, CAPILLARY: Glucose-Capillary: 225 mg/dL — ABNORMAL HIGH (ref 70–99)

## 2012-04-06 LAB — CBC WITH DIFFERENTIAL/PLATELET
Basophils Absolute: 0 10*3/uL (ref 0.0–0.1)
Eosinophils Relative: 1 % (ref 0–5)
Lymphocytes Relative: 21 % (ref 12–46)
MCV: 90.1 fL (ref 78.0–100.0)
Neutro Abs: 6.6 10*3/uL (ref 1.7–7.7)
Neutrophils Relative %: 67 % (ref 43–77)
Platelets: 316 10*3/uL (ref 150–400)
RBC: 5.33 MIL/uL (ref 4.22–5.81)
RDW: 14.2 % (ref 11.5–15.5)
WBC: 9.9 10*3/uL (ref 4.0–10.5)

## 2012-04-06 LAB — BASIC METABOLIC PANEL
CO2: 21 mEq/L (ref 19–32)
Calcium: 8.3 mg/dL — ABNORMAL LOW (ref 8.4–10.5)
Chloride: 122 mEq/L — ABNORMAL HIGH (ref 96–112)
Creatinine, Ser: 1.32 mg/dL (ref 0.50–1.35)
GFR calc Af Amer: 59 mL/min — ABNORMAL LOW (ref >90)
Sodium: 158 mEq/L — ABNORMAL HIGH (ref 135–145)

## 2012-04-06 LAB — COMPREHENSIVE METABOLIC PANEL
ALT: 26 U/L (ref 0–53)
AST: 27 U/L (ref 0–37)
Alkaline Phosphatase: 60 U/L (ref 39–117)
CO2: 23 mEq/L (ref 19–32)
Calcium: 8.4 mg/dL (ref 8.4–10.5)
GFR calc non Af Amer: 43 mL/min — ABNORMAL LOW (ref >90)
Potassium: 3.6 mEq/L (ref 3.5–5.1)
Sodium: 154 mEq/L — ABNORMAL HIGH (ref 135–145)
Total Protein: 7.4 g/dL (ref 6.0–8.3)

## 2012-04-06 LAB — URINALYSIS, ROUTINE W REFLEX MICROSCOPIC
Hgb urine dipstick: NEGATIVE
Specific Gravity, Urine: 1.03 (ref 1.005–1.030)
pH: 5 (ref 5.0–8.0)

## 2012-04-06 LAB — MRSA PCR SCREENING: MRSA by PCR: NEGATIVE

## 2012-04-06 LAB — PROTIME-INR: Prothrombin Time: 17.5 seconds — ABNORMAL HIGH (ref 11.6–15.2)

## 2012-04-06 MED ORDER — CEFTRIAXONE SODIUM 2 G IJ SOLR
2.0000 g | Freq: Two times a day (BID) | INTRAMUSCULAR | Status: DC
  Administered 2012-04-07 – 2012-04-08 (×3): 2 g via INTRAVENOUS
  Filled 2012-04-06 (×4): qty 2

## 2012-04-06 MED ORDER — ONDANSETRON HCL 4 MG PO TABS: 4.0000 mg | ORAL_TABLET | Freq: Four times a day (QID) | ORAL | Status: DC | PRN

## 2012-04-06 MED ORDER — ACETAMINOPHEN 650 MG RE SUPP: 650.0000 mg | Freq: Four times a day (QID) | RECTAL | Status: DC | PRN

## 2012-04-06 MED ORDER — DEXTROSE 5 % IV SOLN
INTRAVENOUS | Status: DC
  Administered 2012-04-06 – 2012-04-07 (×2): via INTRAVENOUS

## 2012-04-06 MED ORDER — INSULIN GLARGINE 100 UNIT/ML ~~LOC~~ SOLN
10.0000 [IU] | Freq: Every day | SUBCUTANEOUS | Status: DC
  Administered 2012-04-06: 10 [IU] via SUBCUTANEOUS

## 2012-04-06 MED ORDER — VANCOMYCIN HCL IN DEXTROSE 1-5 GM/200ML-% IV SOLN
1000.0000 mg | INTRAVENOUS | Status: DC
  Filled 2012-04-06: qty 200

## 2012-04-06 MED ORDER — ONDANSETRON HCL 4 MG/2ML IJ SOLN
4.0000 mg | Freq: Four times a day (QID) | INTRAMUSCULAR | Status: DC | PRN
  Administered 2012-04-13: 4 mg via INTRAVENOUS
  Filled 2012-04-06: qty 2

## 2012-04-06 MED ORDER — SODIUM CHLORIDE 0.9 % IJ SOLN
3.0000 mL | Freq: Two times a day (BID) | INTRAMUSCULAR | Status: DC
  Administered 2012-04-06 – 2012-04-14 (×6): 3 mL via INTRAVENOUS

## 2012-04-06 MED ORDER — VANCOMYCIN HCL IN DEXTROSE 1-5 GM/200ML-% IV SOLN
1000.0000 mg | Freq: Once | INTRAVENOUS | Status: AC
  Administered 2012-04-06: 1000 mg via INTRAVENOUS
  Filled 2012-04-06: qty 200

## 2012-04-06 MED ORDER — DEXTROSE 5 % IV SOLN
2.0000 g | Freq: Once | INTRAVENOUS | Status: AC
  Administered 2012-04-06: 2 g via INTRAVENOUS
  Filled 2012-04-06: qty 2

## 2012-04-06 MED ORDER — SODIUM CHLORIDE 0.9 % IV BOLUS (SEPSIS)
500.0000 mL | Freq: Once | INTRAVENOUS | Status: AC
  Administered 2012-04-06: 500 mL via INTRAVENOUS

## 2012-04-06 MED ORDER — ACETAMINOPHEN 325 MG PO TABS
650.0000 mg | ORAL_TABLET | Freq: Four times a day (QID) | ORAL | Status: DC | PRN
  Administered 2012-04-08 – 2012-04-13 (×2): 650 mg via ORAL
  Filled 2012-04-06 (×2): qty 2

## 2012-04-06 MED ORDER — INSULIN ASPART 100 UNIT/ML ~~LOC~~ SOLN
0.0000 [IU] | Freq: Three times a day (TID) | SUBCUTANEOUS | Status: DC
  Administered 2012-04-07: 5 [IU] via SUBCUTANEOUS
  Administered 2012-04-07: 8 [IU] via SUBCUTANEOUS
  Administered 2012-04-07: 15 [IU] via SUBCUTANEOUS
  Administered 2012-04-08 (×2): 3 [IU] via SUBCUTANEOUS
  Administered 2012-04-08: 15 [IU] via SUBCUTANEOUS
  Administered 2012-04-09 – 2012-04-10 (×4): 3 [IU] via SUBCUTANEOUS
  Administered 2012-04-10: 2 [IU] via SUBCUTANEOUS
  Administered 2012-04-11 (×2): 3 [IU] via SUBCUTANEOUS
  Administered 2012-04-13: 2 [IU] via SUBCUTANEOUS
  Administered 2012-04-13 – 2012-04-14 (×3): 3 [IU] via SUBCUTANEOUS

## 2012-04-06 NOTE — H&P (Signed)
[facility] History and Physical  Aristidis Talerico RUE:[id number] DOB: 1933-11-08 DOA: 04/06/2012  Referring physician: Juliet Rude. Rubin Payor, MD PCP: No primary provider on file.   Chief Complaint: Altered mental status  HPI: Jonathon Lane is a 78 y.o. male with past medical history of diabetes mellitus, dementia and hypertension. He was brought to the hospital from his nursing home because of altered mental status. His baseline is awake and alert, about a week ago the nursing home staff noticed that he is more lethargic with poor oral intake. He was brought to the emergency department and evaluated on 04/01/2012, according to the notes his lethargy resolved and he was back to his baseline. That was believed to be secondary to Depakote. Since then the patient was not eating or drinking very well, according to his wife in the past 2 days he was getting more lethargic so he was brought back to the emergency department for further evaluation. Upon initial evaluation in the emergency department patient only responds to painful stimuli, but he prefers to keep his eyes closed. His sodium level is 154, creatinine is 1.5 and he has normal WBC count of 9.9. [facility] asked to admit him for his altered mental status   Review of Systems: Unable to provide review of systems secondary to his mental status changes  Past Medical History  Diagnosis Date  . Hypertension   . Diabetes mellitus without complication   . Dementia   . BPH (benign prostatic hyperplasia)    No past surgical history on file. Social History:  does not have a smoking history on file. He does not have any smokeless tobacco history on file. His alcohol and drug histories not on file. He lives in a nursing home.  No Known Allergies  No family history on file. unable to provide family history because of his altered mental status  Medication Sig Start Date End Date Taking? Authorizing Provider  aspirin EC 81 MG tablet Take 81  mg by mouth daily.   Yes Historical Provider, MD  Chloroxylenol-Zinc Oxide (BAZA EX) Apply 1 application topically as needed. For incontinence care   Yes Historical Provider, MD  cholecalciferol (VITAMIN D) 1000 UNITS tablet Take 1,000 Units by mouth daily.   Yes Historical Provider, MD  docusate sodium (COLACE) 100 MG capsule Take 100 mg by mouth daily.   Yes Historical Provider, MD  finasteride (PROSCAR) 5 MG tablet Take 5 mg by mouth daily.   Yes Historical Provider, MD  furosemide (LASIX) 20 MG tablet Take 20 mg by mouth daily.   Yes Historical Provider, MD  insulin aspart (NOVOLOG) 100 UNIT/ML injection Inject 2-10 Units into the skin 3 (three) times daily before meals. Per sliding scale: CBG 100-200= 2 units 201-250= 4 units 251-300= 6 units 301-350= 8 units 351-400= 10 units   Yes Historical Provider, MD  insulin detemir (LEVEMIR) 100 UNIT/ML injection Inject 35 Units into the skin daily. Hold if CBG < 120.   Yes Historical Provider, MD  meloxicam (MOBIC) 15 MG tablet Take 15 mg by mouth daily.   Yes Historical Provider, MD  metoprolol (LOPRESSOR) 50 MG tablet Take 50 mg by mouth 2 (two) times daily.   Yes Historical Provider, MD  Multiple Vitamin (MULTIVITAMIN WITH MINERALS) TABS Take 1 tablet by mouth daily.   Yes Historical Provider, MD  nystatin (MYCOSTATIN) powder Apply 1 g topically 2 (two) times daily. Apply to Peri area until healed   Yes Historical Provider, MD  pravastatin (PRAVACHOL) 40 MG tablet Take  40 mg by mouth every evening.    Yes Historical Provider, MD  Tamsulosin HCl (FLOMAX) 0.4 MG CAPS Take 0.4 mg by mouth every evening.    Yes Historical Provider, MD  vitamin B-12 (CYANOCOBALAMIN) 1000 MCG tablet Take 1,000 mcg by mouth daily.   Yes Historical Provider, MD  clonazePAM (KLONOPIN) 0.5 MG tablet Take 0.5 mg by mouth 2 (two) times daily.     Historical Provider, MD  PRESCRIPTION MEDICATION Apply 1 mg topically daily as needed. For agitation   Ativan Topical Gel     Historical Provider, MD   Physical Exam: Filed Vitals:   04/06/12 1304 04/06/12 1330 04/06/12 1415 04/06/12 1500  BP:  119/76 111/63 116/76  Pulse:  102 82 101  Temp: 100.5 F (38.1 C)     TempSrc: Rectal     Resp:  31 31 31   SpO2:  98% 98% 97%    General appearance: Lethargic, open eyes slowly to painful stimuli  Head: Normocephalic, without obvious abnormality, atraumatic  Eyes: conjunctivae/corneas clear. PERRL, EOM's intact. Fundi benign.  Nose: Nares normal. Septum midline. Mucosa normal. No drainage or sinus tenderness.  Throat: lips, mucosa, and tongue normal; teeth and gums normal  Neck: There is  Neck stiffness, he moans when I moved his neck, he also has positive Kernig's sign. Resp: clear to auscultation bilaterally  Chest wall: no tenderness  Cardio: regular rate and rhythm, S1, S2 normal, no murmur, click, rub or gallop  GI: soft, non-tender; bowel sounds normal; no masses, no organomegaly  Extremities: extremities normal, atraumatic, no cyanosis or edema  Skin: Skin color, texture, turgor normal. No rashes or lesions  Neurologic: Lethargic, does not follow simple commands per  Labs on Admission:  Basic Metabolic Panel:  Lab 04/06/12 1610 04/01/12 1252  NA 154* 148*  K 3.6 4.6  CL 117* 111  CO2 23 25  GLUCOSE 159* 203*  BUN 39* 32*  CREATININE 1.52* 1.04  CALCIUM 8.4 8.6  MG -- --  PHOS -- --   Liver Function Tests:  Lab 04/06/12 0948 04/01/12 1252  AST 27 46*  ALT 26 28  ALKPHOS 60 64  BILITOT 0.4 0.3  PROT 7.4 7.1  ALBUMIN 2.4* 2.4*   No results found for this basename: LIPASE:5,AMYLASE:5 in the last 168 hours No results found for this basename: AMMONIA:5 in the last 168 hours CBC:  Lab 04/06/12 0948 04/01/12 1252  WBC 9.9 8.4  NEUTROABS 6.6 --  HGB 15.8 14.0  HCT 48.0 42.2  MCV 90.1 89.8  PLT 316 258   Cardiac Enzymes: No results found for this basename: CKTOTAL:5,CKMB:5,CKMBINDEX:5,TROPONINI:5 in the last 168 hours  BNP (last 3  results) No results found for this basename: PROBNP:3 in the last 8760 hours CBG: No results found for this basename: GLUCAP:5 in the last 168 hours  Radiological Exams on Admission: Dg Chest Port 1 View  04/06/2012  *RADIOLOGY REPORT*  Clinical Data: Fever  PORTABLE CHEST - 1 VIEW  Comparison: None.  Findings: Cardiomediastinal silhouette is unremarkable.  Status post CABG.  No acute infiltrate or pleural effusion.  No pulmonary edema.  Degenerative changes right AC joint.  IMPRESSION: .  No active disease.  Status post CABG.   Original Report Authenticated By: Natasha Mead, M.D.     EKG: Independently reviewed.   Assessment/Plan Principal Problem:  *Hypernatremia Active Problems:  Altered mental state  Hypertension  Diabetes mellitus without complication  Dementia  Fever  ARF (acute renal failure)   Altered mental status -  Could be multifactorial, could be metabolic secondary to hypernatremia or secondary to infection. -Sodium level of 154, this is likely a consequence, I think his altered mental status lead to poor oral intake and dehydration. -Fever of 102.1 of unclear etiology, panculture was done, patient started empirically on Rocephin. -He was presented with the same mental status 5 days ago and improved and he got discharged home. -It's very difficult to do neurological examination on him because of his mental status, I will rule out meningitis. -LP to be done meanwhile cover with antibiotics.  Hypernatremia -Sodium level of 154, this is likely secondary to dehydration and poor oral intake. -Hypernatremia probably complicated his altered mental status. -I start him on D5W at 125 mL/hour, correction rate should not exceed this 0.5 mEq per hour. -I'll check his BMP every 8 hours, if the sodium is correcting rapidly, we have to slow down on the D5W confusion.  Diabetes mellitus, insulin-dependent -In the emergency department his sugar is 154, his long-acting insulin  decreased to 10 units because of n.p.o. status. -I restarted his insulin sliding scale. -His blood glucose expected to be elevated because of D5W infusion.  Acute renal failure -Creatinine baseline is 0.93 on 03/06/2012. Patient presented with creatinine of 1.52. -BUN/Cr ratio is 29, suggesting dehydration. Patient will be only on D5W because of severe hypernatremia. -After hypernatremia corrected, we'll change IV fluids to normal saline containing infusion for hydration.  Dementia -Patient baseline nonambulatory since about 5 months ago. -According to his wife he is very hard of hearing, he hears better on his left side. -On Depakote, all of his dementia medications being held because of altered mental status.  Code Status:  DNR/DNI confirmed with his wife Cathan Gascoigne Family Communication: discussed his case with his wife over the phone. Disposition Plan: stepdown   Time spent:  70 minutes   Bonna Steury A [facility] Pager [phone]   If 7PM-7AM, please contact night-coverage [email or web address] Password [email or web address] 04/06/2012, 3:34 PM

## 2012-04-06 NOTE — ED Notes (Signed)
Wife "patty: called to check on patient. States her knee is bad so she cant be here much. Name and number taken to call her if needed

## 2012-04-06 NOTE — ED Notes (Signed)
Pt's wife called to give permission for lumbar puncture

## 2012-04-06 NOTE — ED Notes (Signed)
Lab at bedside

## 2012-04-06 NOTE — Progress Notes (Signed)
ANTIBIOTIC CONSULT NOTE - INITIAL  Pharmacy Consult for vancomycin + ceftriaxone Indication: ?meningitis  No Known Allergies  Patient Measurements:   Adjusted Body Weight:   Vital Signs: Temp: 100.5 F (38.1 C) (10/30 1304) Temp src: Rectal (10/30 1304) BP: 116/76 mmHg (10/30 1500) Pulse Rate: 101  (10/30 1500) Intake/Output from previous day:   Intake/Output from this shift:    Labs:  [facility] 04/06/12 0948  WBC 9.9  HGB 15.8  PLT 316  LABCREA --  CREATININE 1.52*   CrCl is unknown because there is no height on file for the current visit. No results found for this basename: VANCOTROUGH:2,VANCOPEAK:2,VANCORANDOM:2,GENTTROUGH:2,GENTPEAK:2,GENTRANDOM:2,TOBRATROUGH:2,TOBRAPEAK:2,TOBRARND:2,AMIKACINPEAK:2,AMIKACINTROU:2,AMIKACIN:2, in the last 72 hours   Microbiology: No results found for this or any previous visit (from the past 720 hour(s)).  Medical History: Past Medical History  Diagnosis Date  . Hypertension   . Diabetes mellitus without complication   . Dementia   . BPH (benign prostatic hyperplasia)     Medications:  See med rec  Assessment: 41 yom presented to the ED with AMS to start empiric ceftriaxone + vancomycin for possible meningitis. Spoke with MD and will not broaden coverage further as it is unclear if he really has meningitis. His Tmax is 102.1 and WBC is WNL. Unknown height and weight. Patient is unable to supply information and no family is available. Scr is elevated at 1.5.   CTX 10/30>> Vanc 10/30>>  Goal of Therapy:  Vancomycin trough level 15-20 mcg/ml  Plan:  1. Ceftriaxone 2gm IV Q12H 2. Vanc 1gm IV Q24H - f/u measured inpatient height and weight and adjust dose is necessary 3. F/u renal fxn, C&S, clinical status and trough at [facility]  Yasenia Reedy, Drake Leach 04/06/2012,4:49 PM

## 2012-04-06 NOTE — ED Notes (Signed)
Attempted to notify pt's wife via telephone in order to obtain consent for LP; unsuccessful

## 2012-04-06 NOTE — ED Notes (Signed)
Jonathon Lane (daughter) [phone]

## 2012-04-06 NOTE — ED Notes (Signed)
Pt remains only alert to verbal or physical stimuli.

## 2012-04-06 NOTE — Progress Notes (Signed)
   [facility] critical care service is consulted for LP.  Clint Lipps Pager: [phone] 04/06/2012, 5:25 PM

## 2012-04-06 NOTE — ED Provider Notes (Signed)
History     CSN: [id number]  Arrival date & time 04/06/12  8657   First MD Initiated Contact with Patient 04/06/12 [phone]      Chief Complaint  Patient presents with  . Altered Mental Status  level 5 caveat due to altered mental status  (Consider location/radiation/quality/duration/timing/severity/associated sxs/prior treatment) Patient is a 78 y.o. male presenting with altered mental status. The history is provided by the patient, the EMS personnel and the nursing home. The history is limited by the condition of the patient.  Altered Mental Status   patient was brought in for altered mental status from the nursing home. Reportedly was seen in the ER recently and has been unresponsive for the last few days. Patient apparently will get up and move around at baseline.  Past Medical History  Diagnosis Date  . Hypertension   . Diabetes mellitus without complication   . Dementia   . BPH (benign prostatic hyperplasia)     No past surgical history on file.  No family history on file.  History  Substance Use Topics  . Smoking status: Not on file  . Smokeless tobacco: Not on file  . Alcohol Use:       Review of Systems  Unable to perform ROS Psychiatric/Behavioral: Positive for altered mental status.    Allergies  Review of patient's allergies indicates no known allergies.  Home Medications   Current Outpatient Rx  Name Route Sig Dispense Refill  . ASPIRIN EC 81 MG PO TBEC Oral Take 81 mg by mouth daily.    Marland Kitchen BAZA EX Apply externally Apply 1 application topically as needed. For incontinence care    . VITAMIN D 1000 UNITS PO TABS Oral Take 1,000 Units by mouth daily.    Marland Kitchen DOCUSATE SODIUM 100 MG PO CAPS Oral Take 100 mg by mouth daily.    Marland Kitchen FINASTERIDE 5 MG PO TABS Oral Take 5 mg by mouth daily.    . FUROSEMIDE 20 MG PO TABS Oral Take 20 mg by mouth daily.    . INSULIN ASPART 100 UNIT/ML [place] SOLN Subcutaneous Inject 2-10 Units into the skin 3 (three) times daily before  meals. Per sliding scale: CBG 100-200= 2 units 201-250= 4 units 251-300= 6 units 301-350= 8 units 351-400= 10 units    . INSULIN DETEMIR 100 UNIT/ML [place] SOLN Subcutaneous Inject 35 Units into the skin daily. Hold if CBG < 120.    Marland Kitchen MELOXICAM 15 MG PO TABS Oral Take 15 mg by mouth daily.    Marland Kitchen METOPROLOL TARTRATE 50 MG PO TABS Oral Take 50 mg by mouth 2 (two) times daily.    . ADULT MULTIVITAMIN W/MINERALS CH Oral Take 1 tablet by mouth daily.    . NYSTATIN 100000 UNIT/GM EX POWD Topical Apply 1 g topically 2 (two) times daily. Apply to Peri area until healed    . PRAVASTATIN SODIUM 40 MG PO TABS Oral Take 40 mg by mouth every evening.     Marland Kitchen TAMSULOSIN HCL 0.4 MG PO CAPS Oral Take 0.4 mg by mouth every evening.     Marland Kitchen VITAMIN B-12 1000 MCG PO TABS Oral Take 1,000 mcg by mouth daily.    Marland Kitchen CLONAZEPAM 0.5 MG PO TABS Oral Take 0.5 mg by mouth 2 (two) times daily.     Marland Kitchen PRESCRIPTION MEDICATION Topical Apply 1 mg topically daily as needed. For agitation   Ativan Topical Gel      BP 116/76  Pulse 101  Temp 100.5 F (38.1 C) (  Rectal)  Resp 31  SpO2 97%  Physical Exam  Vitals reviewed. Constitutional: He appears well-developed.  HENT:  Head: Normocephalic.       Mucous membranes are dry  Eyes:       Patient holds his eye shut, however pupils appear reactive.  Neck: Neck supple.  Cardiovascular:       Tachycardia  Pulmonary/Chest: Effort normal.       Transmitted upper airway sounds.  Abdominal: Soft.  Neurological:       Patient will move from pain. Nonverbal. Does not follow commands. Breathing spontaneously  Skin: Skin is warm.  decubitus ulcer without induration.  ED Course  Procedures (including critical care time)  Labs Reviewed  CBC WITH DIFFERENTIAL - Abnormal; Notable for the following:    Monocytes Absolute 1.1 (*)     All other components within normal limits  COMPREHENSIVE METABOLIC PANEL - Abnormal; Notable for the following:    Sodium 154 (*)     Chloride 117 (*)      Glucose, Bld 159 (*)     BUN 39 (*)     Creatinine, Ser 1.52 (*)     Albumin 2.4 (*)     GFR calc non Af Amer 43 (*)     GFR calc Af Amer 50 (*)     All other components within normal limits  URINALYSIS, ROUTINE W REFLEX MICROSCOPIC - Abnormal; Notable for the following:    Color, Urine AMBER (*)  BIOCHEMICALS MAY BE AFFECTED BY COLOR   Bilirubin Urine SMALL (*)     All other components within normal limits  VALPROIC ACID LEVEL - Abnormal; Notable for the following:    Valproic Acid Lvl <10.0 (*)     All other components within normal limits  LACTIC ACID, PLASMA  URINE CULTURE  CULTURE, BLOOD (ROUTINE X 2)  CULTURE, BLOOD (ROUTINE X 2)   Dg Chest Port 1 View  04/06/2012  *RADIOLOGY REPORT*  Clinical Data: Fever  PORTABLE CHEST - 1 VIEW  Comparison: None.  Findings: Cardiomediastinal silhouette is unremarkable.  Status post CABG.  No acute infiltrate or pleural effusion.  No pulmonary edema.  Degenerative changes right AC joint.  IMPRESSION: .  No active disease.  Status post CABG.   Original Report Authenticated By: Natasha Mead, M.D.      1. Dehydration   2. Fever   3. Hypernatremia   4. Hypertension   5. Diabetes mellitus without complication   6. Dementia     Date: 113  Rhythm: sinus tachycardia  QRS Axis: left  Intervals: normal  ST/T Wave abnormalities: normal  Conduction Disutrbances:none and nonspecific intraventricular conduction delay  Narrative Interpretation:   Old EKG Reviewed: none available     MDM  Patient presents with altered mental status and a fever. Lab work shows increased hypernatremia compared to recent visit. Also his creatinine has increased. There is no clear source of infection his urine chest x-ray are fine. He does have a decubitus ulcer but does not clearly look infected. Patient will be admitted to medicine for further evaluation and treatment.        Juliet Rude. Rubin Payor, MD 04/06/12 1601

## 2012-04-06 NOTE — ED Notes (Signed)
Pt moaned and moved arms and legs when FC inserted

## 2012-04-06 NOTE — ED Notes (Signed)
Pt ? Treated here on Sat. Nursing home staff states he has been non responsive since then.

## 2012-04-07 ENCOUNTER — Inpatient Hospital Stay (HOSPITAL_COMMUNITY): Payer: Medicare Other

## 2012-04-07 ENCOUNTER — Encounter (HOSPITAL_COMMUNITY): Payer: Self-pay | Admitting: Family Medicine

## 2012-04-07 DIAGNOSIS — D72829 Elevated white blood cell count, unspecified: Secondary | ICD-10-CM | POA: Diagnosis not present

## 2012-04-07 DIAGNOSIS — R291 Meningismus: Secondary | ICD-10-CM | POA: Diagnosis present

## 2012-04-07 DIAGNOSIS — R7881 Bacteremia: Secondary | ICD-10-CM

## 2012-04-07 DIAGNOSIS — L89150 Pressure ulcer of sacral region, unstageable: Secondary | ICD-10-CM | POA: Diagnosis present

## 2012-04-07 DIAGNOSIS — J9600 Acute respiratory failure, unspecified whether with hypoxia or hypercapnia: Secondary | ICD-10-CM

## 2012-04-07 DIAGNOSIS — J9601 Acute respiratory failure with hypoxia: Secondary | ICD-10-CM | POA: Diagnosis present

## 2012-04-07 LAB — BASIC METABOLIC PANEL
BUN: 35 mg/dL — ABNORMAL HIGH (ref 6–23)
BUN: 30 mg/dL — ABNORMAL HIGH (ref 6–23)
CO2: 21 mEq/L (ref 19–32)
Calcium: 8.1 mg/dL — ABNORMAL LOW (ref 8.4–10.5)
Calcium: 7.8 mg/dL — ABNORMAL LOW (ref 8.4–10.5)
Calcium: 8.1 mg/dL — ABNORMAL LOW (ref 8.4–10.5)
Creatinine, Ser: 1.11 mg/dL (ref 0.50–1.35)
GFR calc Af Amer: >90 mL/min (ref >90)
GFR calc non Af Amer: 58 mL/min — ABNORMAL LOW (ref >90)
GFR calc non Af Amer: 63 mL/min — ABNORMAL LOW (ref >90)
GFR calc non Af Amer: 80 mL/min — ABNORMAL LOW (ref >90)
Glucose, Bld: 361 mg/dL — ABNORMAL HIGH (ref 70–99)
Glucose, Bld: 166 mg/dL — ABNORMAL HIGH (ref 70–99)
Potassium: 3.7 mEq/L (ref 3.5–5.1)
Potassium: 3.4 mEq/L — ABNORMAL LOW (ref 3.5–5.1)
Sodium: 151 mEq/L — ABNORMAL HIGH (ref 135–145)
Sodium: 154 mEq/L — ABNORMAL HIGH (ref 135–145)

## 2012-04-07 LAB — PROCALCITONIN: Procalcitonin: 0.1 ng/mL

## 2012-04-07 LAB — GLUCOSE, CAPILLARY: Glucose-Capillary: 362 mg/dL — ABNORMAL HIGH (ref 70–99)

## 2012-04-07 LAB — URINE CULTURE: Culture: NO GROWTH

## 2012-04-07 LAB — CBC
Hemoglobin: 14.3 g/dL (ref 13.0–17.0)
Platelets: 282 10*3/uL (ref 150–400)
RBC: 4.88 MIL/uL (ref 4.22–5.81)
WBC: 10.7 10*3/uL — ABNORMAL HIGH (ref 4.0–10.5)

## 2012-04-07 LAB — INFLUENZA PANEL BY PCR (TYPE A & B)
Influenza A By PCR: NEGATIVE
Influenza B By PCR: NEGATIVE

## 2012-04-07 LAB — HEMOGLOBIN A1C: Hgb A1c MFr Bld: 7.3 % — ABNORMAL HIGH (ref <5.7)

## 2012-04-07 MED ORDER — VANCOMYCIN HCL IN DEXTROSE 1-5 GM/200ML-% IV SOLN
1000.0000 mg | Freq: Two times a day (BID) | INTRAVENOUS | Status: DC
  Administered 2012-04-07 – 2012-04-08 (×3): 1000 mg via INTRAVENOUS
  Filled 2012-04-07 (×4): qty 200

## 2012-04-07 MED ORDER — INSULIN GLARGINE 100 UNIT/ML ~~LOC~~ SOLN
25.0000 [IU] | Freq: Every day | SUBCUTANEOUS | Status: DC
  Administered 2012-04-07 – 2012-04-14 (×8): 25 [IU] via SUBCUTANEOUS

## 2012-04-07 MED ORDER — COLLAGENASE 250 UNIT/GM EX OINT
TOPICAL_OINTMENT | Freq: Every day | CUTANEOUS | Status: DC
  Administered 2012-04-07 – 2012-04-15 (×9): via TOPICAL
  Filled 2012-04-07 (×2): qty 30

## 2012-04-07 MED ORDER — CHLORHEXIDINE GLUCONATE 0.12 % MT SOLN
15.0000 mL | Freq: Two times a day (BID) | OROMUCOSAL | Status: DC
  Administered 2012-04-07 – 2012-04-14 (×8): 15 mL via OROMUCOSAL
  Filled 2012-04-07 (×11): qty 15

## 2012-04-07 MED ORDER — SODIUM CHLORIDE 0.9 % IV BOLUS (SEPSIS)
500.0000 mL | Freq: Once | INTRAVENOUS | Status: AC
  Administered 2012-04-07: 500 mL via INTRAVENOUS

## 2012-04-07 MED ORDER — BIOTENE DRY MOUTH MT LIQD
15.0000 mL | Freq: Two times a day (BID) | OROMUCOSAL | Status: DC
  Administered 2012-04-07 – 2012-04-14 (×10): 15 mL via OROMUCOSAL

## 2012-04-07 MED ORDER — SODIUM CHLORIDE 0.9 % IV SOLN
INTRAVENOUS | Status: DC
  Administered 2012-04-07 (×2): 1000 mL via INTRAVENOUS
  Administered 2012-04-08: 02:00:00 via INTRAVENOUS

## 2012-04-07 NOTE — Progress Notes (Signed)
Inpatient Diabetes Program Recommendations  AACE/ADA: New Consensus Statement on Inpatient Glycemic Control (2013)  Target Ranges:  Prepandial:   less than 140 mg/dL      Peak postprandial:   less than 180 mg/dL (1-2 hours)      Critically ill patients:  140 - 180 mg/dL   Reason for Visit: Results for MART, MARECKI (MRN [id number]) as of 04/07/2012 11:46  Ref. Range 04/07/2012 00:10 04/07/2012 04:40 04/07/2012 08:11  Glucose-Capillary Latest Range: 70-99 mg/dL 782 (H) 956 (H) 213 (H)   Please consider increasing Lantus to 25 units daily (Note patient was on Detemir 35 units daily prior to admit).  Will follow.

## 2012-04-07 NOTE — Progress Notes (Signed)
Pt CBG this am is 352. Pt on D5W@125 . Also, pt RR between 30-36. Pt does not appear to be in any distress. Sats 96-98% on 2L. Resting comfortably at this time. K.Schorr, NP notified. No new orders at this time. Will continue to monitor.   M.Foster Simpson, RN

## 2012-04-07 NOTE — Progress Notes (Signed)
[facility] Progress Note [place] TEAM 1 - Stepdown/ICU TEAM   Jonathon Lane ZOX:[id number] DOB: 31-Oct-1933 DOA: 04/06/2012 PCP: No primary provider on file.  Brief narrative: Jonathon Lane is a 78 y.o. male with past medical history of diabetes mellitus, dementia and hypertension. He was brought to the hospital from his nursing home because of altered mental status. His baseline is awake and alert, about a week ago the nursing home staff noticed that he is more lethargic with poor oral intake. He was brought to the emergency department and evaluated on 04/01/2012, according to the notes his lethargy resolved and he was back to his baseline. That was believed to be secondary to Depakote. Since then the patient was not eating or drinking very well, according to his wife in the past 2 days he was getting more lethargic so he was brought back to the emergency department for further evaluation. Upon initial evaluation in the emergency department patient only responds to painful stimuli, but he prefers to keep his eyes closed. His sodium level is 154, creatinine is 1.5 and he has normal WBC count of 9.9.   Assessment/Plan: Principal Problem:  *Dehydration with hypernatremia *Sodium has trended downward with dextrose IV solution. Will change to normal saline and keep 125 cc per hour *An additional 500 cc normal saline bolus today *Although electrolytes  Active Problems:  Altered mental state *Seems to have multifactorial etiology related to dehydration with hypernatremia as well as recent fever and infectious processes *Given nuchal rigidity type symptoms at presentation meningitis is also in the differential *Is awake today but apparently not at baseline-according to the family the patient was ambulatory before being admitted to the skilled nursing facility *N.p.o. until more alert-may require swallowing study before initiating orals   Fever/ Leukocytosis/Nuchal rigidity *Fever has  resolved *CBC is pending *Still seems to have a degree of nuchal rigidity-critical care medicine was consulted on 04/06/2012 for lumbar puncture-permit just recently signed *Has been started on empiric antibiotic coverage so unsure diagnostic yield of lumbar puncture at this time *Neck pain/decreased neck mobility could also be related to influenza so we'll check an influenza panel PCR *Procalcitonin level is normal at 0.10 but lactic acid has slightly increased to 2.1 despite hydration   Bacteremia due to Gram-positive bacteria *Etiology could be related to a possible meningitis versus possible pneumonia *One out of two bottles has grown gram-positive cocci in change which appears to be likely consistent with streptococcal infection *Continue empiric antibiotics-see below   Hypertension *Blood pressure is soft so usual antihypertensive medications on hold *Was on a beta blocker prior to admission so watch for breakthrough tachycardia from beta blocker withdrawal   Diabetes mellitus type 2, uncontrolled *CBGs have been greater than 300 since admission *Increase current Lantus dose from 10 units to 35 units at hour of sleep and continue moderate sliding scale insulin *We have also discontinued the dextrose IV infusion in favor of normal saline for rehydration so this should help CBGs decreased as well *Appreciate diabetes educator assistance *Hemoglobin A1c is 7.3   Dementia *Before admission to skilled nursing facility patient apparently was ambulatory but actual baseline mental status unclear so we'll need to clarify with family *Was not on medications prior to admission    ARF (acute renal failure) *Seems to regular related to dehydration with BUN and creatinine decreasing after hydration initiated *Continue to follow electrolyte panel   Acute respiratory failure with hypoxia *Was on room air prior to admission with sats between 95 and  99% and subsequently been started on 2 L nasal  cannula oxygen with sats 96% *Lactic acid level is mildly elevated and persistent greater than 2 and this may explain patient's persistent tachypnea *After hydration patient has developed a wet sounding cough which leads [place] to suspect he may have had an underlying pneumonia process that was not easily seen on x-ray because of dehydration or he could have an underlying viral pneumonitis henceforth another rationale for checking influenza PCR *Continue supportive care with oxygen and pulmonary toileting *Repeat chest x-ray after adequate hydration *Blood cultures positive for gram-positive cocci so current antibiotic coverage would be appropriate if pneumonia would be the source    Decubitus ulcer of sacral region, unstageable *Appreciate wound ostomy nurse evaluation  *Air mattress overlay has been ordered and recommendations are to begin Santyl ointment to chemically debride the area which is covered by 100% eschar. The area measures 5 x 3 cm diameter    DVT prophylaxis: SCDs  Code Status:  DO NOT RESUSCITATE  Family Communication:  no family at bedside at time of evaluation  Disposition Plan:  remain in step down   Consultants:  critical care medicine for lumbar puncture procedure   Procedures:  lumbar puncture pending   Antibiotics:  Rocephin 10/30 >>> Vancomycin 10/30 >>>  HPI/Subjective: Patient is arousable and attempts to verbally communicate but appears to be unable to do so. Unable to determine if he is experiencing any pain or other symptoms.   Objective: Blood pressure 115/69, pulse 99, temperature 98.6 F (37 C), temperature source Axillary, resp. rate 30, height 5\' 11"  (1.803 m), weight 79.7 kg (175 lb 11.3 oz), SpO2 96.00%.  Intake/Output Summary (Last 24 hours) at 04/07/12 1309 Last data filed at 04/07/12 0900  Gross per 24 hour  Intake 1730.08 ml  Output    800 ml  Net 930.08 ml     Exam: General: No acute respiratory distress Lungs: Coarse to auscultation  bilaterally without any definitive wheezes or crackles. As a cannula oxygen at 2 L per minute, persistent tachypnea since admission  Cardiovascular: Regular rate and rhythm without murmur gallop or rub normal S1 and S2, IV fluid at 125 cc per hour  Abdomen: Nontender, nondistended, soft, bowel sounds positive, no rebound, no ascites, no appreciable mass Musculoskeletal: No significant cyanosis, clubbing of bilateral lower extremities Neurological: Awakens but is not alert and still remains lethargic. Exquisitely weak movement of extremities and not on command. Appears to be tracking with eyes but does not appear to be able to move neck and may be experiencing neck pain. No appreciable focal neurological deficit   Data Reviewed: Basic Metabolic Panel:  Lab 04/07/12 9562 04/07/12 0450 04/06/12 2101 04/06/12 0948 04/01/12 1252  NA 152* 151* 158* 154* 148*  K 3.4* 3.7 4.0 3.6 4.6  CL 118* 118* 122* 117* 111  CO2 23 21 21 23 25   GLUCOSE 361* 398* 260* 159* 203*  BUN 35* 39* 41* 39* 32*  CREATININE 1.11 1.19 1.32 1.52* 1.04  CALCIUM 7.8* 8.1* 8.3* 8.4 8.6  MG -- -- -- -- --  PHOS -- -- -- -- --   Liver Function Tests:  Lab 04/06/12 0948 04/01/12 1252  AST 27 46*  ALT 26 28  ALKPHOS 60 64  BILITOT 0.4 0.3  PROT 7.4 7.1  ALBUMIN 2.4* 2.4*   No results found for this basename: LIPASE:5,AMYLASE:5 in the last 168 hours No results found for this basename: AMMONIA:5 in the last 168 hours CBC:  Lab 04/07/12  1610 04/06/12 0948 04/01/12 1252  WBC 10.7* 9.9 8.4  NEUTROABS -- 6.6 --  HGB 14.3 15.8 14.0  HCT 44.4 48.0 42.2  MCV 91.0 90.1 89.8  PLT 282 316 258   Cardiac Enzymes: No results found for this basename: CKTOTAL:5,CKMB:5,CKMBINDEX:5,TROPONINI:5 in the last 168 hours BNP (last 3 results) No results found for this basename: PROBNP:3 in the last 8760 hours CBG:  Lab 04/07/12 0811 04/07/12 0440 04/07/12 0010 04/06/12 2106  GLUCAP 362* 352* 313* 225*    Recent Results (from the  past 240 hour(s))  URINE CULTURE     Status: Normal   Collection Time   04/06/12  9:55 AM      Component Value Range Status Comment   Specimen Description URINE, CATHETERIZED   Final    Special Requests NONE   Final    Culture  Setup Time 04/06/2012 15:40   Final    Colony Count NO GROWTH   Final    Culture NO GROWTH   Final    Report Status 04/07/2012 FINAL   Final   CULTURE, BLOOD (ROUTINE X 2)     Status: Normal (Preliminary result)   Collection Time   04/06/12 10:00 AM      Component Value Range Status Comment   Specimen Description BLOOD RIGHT ARM   Final    Special Requests BOTTLES DRAWN AEROBIC AND ANAEROBIC 10CC   Final    Culture  Setup Time 04/06/2012 15:22   Final    Culture     Final    Value:        BLOOD CULTURE RECEIVED NO GROWTH TO DATE CULTURE WILL BE HELD FOR 5 DAYS BEFORE ISSUING A FINAL NEGATIVE REPORT   Report Status PENDING   Incomplete   CULTURE, BLOOD (ROUTINE X 2)     Status: Normal (Preliminary result)   Collection Time   04/06/12 10:12 AM      Component Value Range Status Comment   Specimen Description BLOOD RIGHT ARM   Final    Special Requests BOTTLES DRAWN AEROBIC AND ANAEROBIC 10CC   Final    Culture  Setup Time 04/06/2012 15:23   Final    Culture     Final    Value: GRAM POSITIVE COCCI IN PAIRS     Note: Gram Stain Report Called to,Read Back By and Verified With: TATA CARBONE @0910  04/07/12 BY KRAWS   Report Status PENDING   Incomplete   MRSA PCR SCREENING     Status: Normal   Collection Time   04/06/12  9:03 PM      Component Value Range Status Comment   MRSA by PCR NEGATIVE  NEGATIVE Final      Studies:  Recent x-ray studies have been reviewed in detail by the Attending Physician  Scheduled Meds:  Reviewed in detail by the Attending Physician   Junious Silk, ANP [facility] Office  [phone] Pager [phone]  On-Call/Text Page:      [email or web address]      password [email or web address]  If 7PM-7AM, please contact  night-coverage [email or web address] Password [email or web address] 04/07/2012, 1:09 PM   LOS: 1 day   I have examined the patient and reviewed the chart. I agree with the above note which I have modified.   Calvert Cantor, MD [phone]

## 2012-04-07 NOTE — Consult Note (Addendum)
WOC consult Note Reason for Consult: Consult requested for sacral wound. Pt is dehabilitated and has multiple systemic factors which could impair healing.  Emaciated, incontinent, obtunded, immobile. Wound type: Unstageable. Pressure Ulcer POA: Yes Measurement:5X3cm Wound bed: 100% brown eschar, tightly adhered. Drainage (amount, consistency, odor) Strong foul odor, mod tan drainage. Periwound: Erythremia surrounding. Dressing procedure/placement/frequency: Santyl ointment to chemically debride nonviable tissue.  Air mattress has been ordered to reduce pressure.  Nutrition consult has been performed. If lab work indicates infection is a concern, then pt could benefit from X-ray to R/O osteomyelitis.  Cammie Mcgee, RN, MSN, [employer]  [phone]

## 2012-04-07 NOTE — Progress Notes (Signed)
Patient is very hard of hearing to his right ear. Can hear some on his left ear.  Info received from patient's wife, Alexia Freestone.

## 2012-04-07 NOTE — Progress Notes (Signed)
CRITICAL VALUE ALERT  Critical value received:  Blood culture done on 04/06/12 @ 1030 am; Aerobic bottle gm (+) cocci in pairs  Date of notification: 04/07/2012   Time of notification:  0912  Critical value read back:yes  Nurse who received alert:  Gerald Dexter, RN  MD notified (1st page):  Dr. Butler Denmark  Time of first page: 0921  MD notified (2nd page):  Time of second page:  Responding MD:  Seen by Dr. Butler Denmark  Time MD responded:  [phone]

## 2012-04-07 NOTE — Progress Notes (Signed)
INITIAL ADULT NUTRITION ASSESSMENT Date: 04/07/2012   Time: 10:47 AM Reason for Assessment: MST  ASSESSMENT: Male 78 y.o.  Dx: Hypernatremia  Hx:  Past Medical History  Diagnosis Date  . Hypertension   . Diabetes mellitus without complication   . Dementia   . BPH (benign prostatic hyperplasia)    History reviewed. No pertinent past surgical history.  Related Meds:  Scheduled Meds:   . antiseptic oral rinse  15 mL Mouth Rinse q12n4p  . cefTRIAXone (ROCEPHIN)  IV  2 g Intravenous Once  . cefTRIAXone (ROCEPHIN)  IV  2 g Intravenous Q12H  . chlorhexidine  15 mL Mouth Rinse BID  . insulin aspart  0-15 Units Subcutaneous TID WC  . insulin glargine  10 Units Subcutaneous QHS  . sodium chloride  500 mL Intravenous Once  . sodium chloride  500 mL Intravenous Once  . sodium chloride  3 mL Intravenous Q12H  . vancomycin  1,000 mg Intravenous Once  . vancomycin  1,000 mg Intravenous Q12H  . DISCONTD: vancomycin  1,000 mg Intravenous Q24H   Continuous Infusions:   . dextrose 125 mL/hr at 04/07/12 0652   PRN Meds:.acetaminophen, acetaminophen, ondansetron (ZOFRAN) IV, ondansetron   Ht: 5\' 11"  (180.3 cm)  Wt: 175 lb 11.3 oz (79.7 kg)  Ideal Wt: 172 lbs % Ideal Wt: 101%  Usual Wt: unable to assess  Body mass index is 24.51 kg/(m^2). WNL  Food/Nutrition Related Hx: poor PO PTA per chart review  Labs:  CMP     Component Value Date/Time   NA 151* 04/07/2012 0450   K 3.7 04/07/2012 0450   CL 118* 04/07/2012 0450   CO2 21 04/07/2012 0450   GLUCOSE 398* 04/07/2012 0450   BUN 39* 04/07/2012 0450   CREATININE 1.19 04/07/2012 0450   CALCIUM 8.1* 04/07/2012 0450   PROT 7.4 04/06/2012 0948   ALBUMIN 2.4* 04/06/2012 0948   AST 27 04/06/2012 0948   ALT 26 04/06/2012 0948   ALKPHOS 60 04/06/2012 0948   BILITOT 0.4 04/06/2012 0948   GFRNONAA 58* 04/07/2012 0450   GFRAA 67* 04/07/2012 0450    CBG (last 3)   Basename 04/07/12 0811 04/07/12 0440 04/07/12 0010  GLUCAP  362* 352* 313*   Lab Results  Component Value Date   HGBA1C 7.3* 04/06/2012    Intake: NPO Output:   Intake/Output Summary (Last 24 hours) at 04/07/12 1052 Last data filed at 04/07/12 0900  Gross per 24 hour  Intake 1730.08 ml  Output    800 ml  Net 930.08 ml   1 BM overnight  Diet Order: NPO  Supplements/Tube Feeding:  None at this time  IVF:    dextrose Last Rate: 125 mL/hr at 04/07/12 0652    Estimated Nutritional Needs:   Kcal: 1820-1990 Protein: 79-94g Fluid: >1.8 L/day  Pt admitted with AMS.  Pt with several visits to the ER recently for falls and AMS.  Pt with dementia, but some mobility at baseline per chart review.  Pt unable to answer questions.  RD attempted to call home/wife (Patty) to discuss report of poor appetite, intake, and wt loss, however no answer at this time.  Pt currently NPO.  RD to follow for nutrition hx and recent changes.  Unable to assess for malnutrition at this time Pt s/p lumbar puncture to r/o meningitis. Note pt with elevated CBGs and HgBA1C >7%.  NUTRITION DIAGNOSIS: -Inadequate oral intake (NI-2.1).  Status: Ongoing  RELATED TO: AMS  AS EVIDENCE BY: chart review, wife report  MONITORING/EVALUATION(Goals): 1.  Food/Beverage; pt to improve intake to eating as desired. 2.  Wt/wt change; monitor trends.  EDUCATION NEEDS: -No education needs identified at this time  INTERVENTION: 1.  Modify diet; per MD discretion as medically appropriate.  SNF currently unknown to obtain information re: home diet.  RD to follow for nutrition-related care plan.  Please consult RD if AMS prevents resume of diet and nutrition support warranted/appropriate.   DOCUMENTATION CODES Per approved criteria  -unable to assess    Hoyt Koch 04/07/2012, 10:47 AM

## 2012-04-08 ENCOUNTER — Encounter (HOSPITAL_COMMUNITY): Payer: Self-pay | Admitting: *Deleted

## 2012-04-08 DIAGNOSIS — R001 Bradycardia, unspecified: Secondary | ICD-10-CM | POA: Diagnosis not present

## 2012-04-08 DIAGNOSIS — E876 Hypokalemia: Secondary | ICD-10-CM | POA: Diagnosis not present

## 2012-04-08 LAB — CULTURE, BLOOD (ROUTINE X 2)

## 2012-04-08 LAB — GLUCOSE, CAPILLARY
Glucose-Capillary: 175 mg/dL — ABNORMAL HIGH (ref 70–99)
Glucose-Capillary: 215 mg/dL — ABNORMAL HIGH (ref 70–99)

## 2012-04-08 LAB — CBC
Hemoglobin: 12.2 g/dL — ABNORMAL LOW (ref 13.0–17.0)
MCH: 29.3 pg (ref 26.0–34.0)
MCV: 90.4 fL (ref 78.0–100.0)
RBC: 4.16 MIL/uL — ABNORMAL LOW (ref 4.22–5.81)
WBC: 6.7 10*3/uL (ref 4.0–10.5)

## 2012-04-08 LAB — COMPREHENSIVE METABOLIC PANEL
ALT: 23 U/L (ref 0–53)
AST: 28 U/L (ref 0–37)
CO2: 23 mEq/L (ref 19–32)
Calcium: 7.9 mg/dL — ABNORMAL LOW (ref 8.4–10.5)
Chloride: 125 mEq/L — ABNORMAL HIGH (ref 96–112)
Creatinine, Ser: 0.88 mg/dL (ref 0.50–1.35)
GFR calc Af Amer: >90 mL/min (ref >90)
GFR calc non Af Amer: 81 mL/min — ABNORMAL LOW (ref >90)
Glucose, Bld: 212 mg/dL — ABNORMAL HIGH (ref 70–99)
Total Bilirubin: 0.2 mg/dL — ABNORMAL LOW (ref 0.3–1.2)

## 2012-04-08 MED ORDER — POTASSIUM CHLORIDE CRYS ER 20 MEQ PO TBCR
40.0000 meq | EXTENDED_RELEASE_TABLET | Freq: Two times a day (BID) | ORAL | Status: AC
  Administered 2012-04-09: 40 meq via ORAL
  Filled 2012-04-08 (×3): qty 2

## 2012-04-08 MED ORDER — SODIUM CHLORIDE 0.45 % IV SOLN
INTRAVENOUS | Status: DC
  Administered 2012-04-08: 1000 mL via INTRAVENOUS
  Administered 2012-04-08 – 2012-04-09 (×2): via INTRAVENOUS

## 2012-04-08 MED ORDER — LEVOFLOXACIN IN D5W 750 MG/150ML IV SOLN
750.0000 mg | INTRAVENOUS | Status: DC
  Administered 2012-04-08: 750 mg via INTRAVENOUS
  Filled 2012-04-08: qty 150

## 2012-04-08 MED ORDER — PIPERACILLIN-TAZOBACTAM 3.375 G IVPB
3.3750 g | Freq: Three times a day (TID) | INTRAVENOUS | Status: DC
  Administered 2012-04-08: 3.375 g via INTRAVENOUS
  Filled 2012-04-08 (×2): qty 50

## 2012-04-08 NOTE — Evaluation (Signed)
Clinical/Bedside Swallow Evaluation Patient Details  Name: Jonathon Lane MRN: [id number] Date of Birth: 1934/05/21  Today's Date: 04/08/2012 Time: 8469-6295 SLP Time Calculation (min): 23 min  Past Medical History:  Past Medical History  Diagnosis Date  . Hypertension   . Diabetes mellitus without complication   . Dementia   . BPH (benign prostatic hyperplasia)    Past Surgical History: History reviewed. No pertinent past surgical history. HPI:  Jonathon Lane is a 78 y.o. male with past medical history of diabetes mellitus, dementia and hypertension. He was brought to the hospital from his nursing home because of altered mental status. His baseline is awake and alert, about a week ago the nursing home staff noticed that he is more lethargic with poor oral intake. He was brought to the emergency department and evaluated on 04/01/2012, according to the notes his lethargy resolved and he was back to his baseline. That was believed to be secondary to Depakote. Since then the patient was not eating or drinking very well, according to his wife in the past 2 days he was getting more lethargic so he was brought back to the emergency department for further evaluation. Upon initial evaluation in the emergency department patient only responds to painful stimuli, but he prefers to keep his eyes closed.    Assessment / Plan / Recommendation Clinical Impression  Pt presents with appearance of normal oropharyngeal function with no evidence of aspiration. Pt does have generalized weakness and is missing most dentition. Matsication of minimal solids is functional but prolongued. Recommend pt initate a mechanical soft diet (for energy conservation) with thin liquids and basic aspiration precautions (upright posture). No SLP f/u needed.     Aspiration Risk  None    Diet Recommendation Dysphagia 3 (Mechanical Soft);Thin liquid   Liquid Administration via: Straw;Cup Medication Administration: Whole meds with  liquid Supervision: Full supervision/cueing for compensatory strategies Compensations: Slow rate;Small sips/bites Postural Changes and/or Swallow Maneuvers: Seated upright 90 degrees    Other  Recommendations Oral Care Recommendations: Oral care BID   Follow Up Recommendations  None    Frequency and Duration        Pertinent Vitals/Pain NA    SLP Swallow Goals     Swallow Study Prior Functional Status       General HPI: Jonathon Lane is a 78 y.o. male with past medical history of diabetes mellitus, dementia and hypertension. He was brought to the hospital from his nursing home because of altered mental status. His baseline is awake and alert, about a week ago the nursing home staff noticed that he is more lethargic with poor oral intake. He was brought to the emergency department and evaluated on 04/01/2012, according to the notes his lethargy resolved and he was back to his baseline. That was believed to be secondary to Depakote. Since then the patient was not eating or drinking very well, according to his wife in the past 2 days he was getting more lethargic so he was brought back to the emergency department for further evaluation. Upon initial evaluation in the emergency department patient only responds to painful stimuli, but he prefers to keep his eyes closed.  Type of Study: Bedside swallow evaluation Previous Swallow Assessment: none on record.  Diet Prior to this Study: NPO Temperature Spikes Noted: No Respiratory Status: Room air History of Recent Intubation: No Behavior/Cognition: Alert;Cooperative;Pleasant mood;Confused Oral Cavity - Dentition: Missing dentition Self-Feeding Abilities: Needs assist Patient Positioning: Upright in bed Baseline Vocal Quality: Clear Volitional Cough: Strong Volitional  Swallow: Able to elicit    Oral/Motor/Sensory Function Overall Oral Motor/Sensory Function: Appears within functional limits for tasks assessed   Ice Chips Ice chips:  Within functional limits   Thin Liquid Thin Liquid: Within functional limits Presentation: Cup;Straw    Nectar Thick Nectar Thick Liquid: Not tested   Honey Thick Honey Thick Liquid: Not tested   Puree Puree: Within functional limits   Solid   GO    Solid: Impaired Oral Phase Impairments: Impaired anterior to posterior transit Oral Phase Functional Implications: Oral residue;Other (comment) (prolonged mastication)      Jonathon Ditty, MA CCC-SLP [phone]  Jonathon Lane, Jonathon Lane 04/08/2012,1:53 PM

## 2012-04-08 NOTE — Progress Notes (Signed)
Patient is more talkative today and more responsive. He is very hard of hearing. Daughter, Waynetta Sandy, is made aware of this transfer to [facility].  Ms. Junious Silk, NP is made aware of patient's cardiac rhythm being in the 40's to 50's.

## 2012-04-08 NOTE — Clinical Social Work Psychosocial (Signed)
     Clinical Social Work Department BRIEF PSYCHOSOCIAL ASSESSMENT 04/08/2012  Patient:  Jonathon Lane, Jonathon Lane     Account Number:  [id number]     Admit date:  04/06/2012  Clinical Social Worker:  Margaree Mackintosh  Date/Time:  04/08/2012 12:00 M  Referred by:  Physician  Date Referred:  04/08/2012 Referred for  SNF Placement   Other Referral:   Interview type:  Family Other interview type:   Pt currently unable to fully participate in assessment.    PSYCHOSOCIAL DATA Living Status:  FACILITY Admitted from facility:  [facility] Level of care:  Assisted Living Primary support name:  Patty Primary support relationship to patient:  SPOUSE Degree of support available:   Adequate.    CURRENT CONCERNS Current Concerns  Post-Acute Placement   Other Concerns:    SOCIAL WORK ASSESSMENT / PLAN Clinical Social Worker recieved referral indicating pt is from ALF.  CSW reviewed chart and spoke with pt's spouse and dtr.  Both spouse and dtr are in agreement that they do not wish for pt to return to his ALF.  They are interested in SNFs.  CSW reviewed SNF process.  CSW staffed case wtih [facility] and updated MD.  CSW began SNF search.  CSW to continue to follow and assist as needed.   Assessment/plan status:  Information/Referral to [facility] Other assessment/ plan:   Information/referral to community resources:   SNF.    PATIENTS/FAMILYS RESPONSE TO PLAN OF CARE: Family was pleasant and engaged in conversation.  Family thanked CSW for intervention.

## 2012-04-08 NOTE — Progress Notes (Signed)
Received patient from 2600, alert but confused, VSS, will continue to monitor.

## 2012-04-08 NOTE — Progress Notes (Signed)
[facility] Progress Note [place] TEAM 1 - Stepdown/ICU TEAM   Jonathon Lane WUJ:[id number] DOB: August 30, 1933 DOA: 04/06/2012 PCP: No primary provider on file.  Brief narrative: Jonathon Lane is a 78 y.o. male with past medical history of diabetes mellitus, dementia and hypertension. He was brought to the hospital from his nursing home because of altered mental status. His baseline is awake and alert, about a week ago the nursing home staff noticed that he is more lethargic with poor oral intake. He was brought to the emergency department and evaluated on 04/01/2012, according to the notes his lethargy resolved and he was back to his baseline. That was believed to be secondary to Depakote. Since then the patient was not eating or drinking very well, according to his wife in the past 2 days he was getting more lethargic so he was brought back to the emergency department for further evaluation. Upon initial evaluation in the emergency department patient only responds to painful stimuli, but he prefers to keep his eyes closed. His sodium level is 154, creatinine is 1.5 and he has normal WBC count of 9.9.  Assessment/Plan:  Dehydration with hypernatremia *Sodium has trended back upward with NS so will change to 1/2 NS and increase rate to 150/hr- still seems quite volume repleted *Follow electrolytes  Altered mental state *Mental status has markedly improved-now talking and requesting food *Seems to have multifactorial etiology related to dehydration with hypernatremia as well as recent fever and infectious processes *Given reported nuchal rigidity type symptoms at presentation meningitis was in the differential- unfortunately has received 48 hrs+ of antibiotics and LP has not yet been done. Dr. Sharon Seller has reviewed the HPI and given rapid improvement no indications to pursue meningitis work up so will dc LP as the results would likely be very low yield at this time *Have requested ST  swallowing study before initiating orals  Sinus Bradycardia * Was on beta blockers prior to admit- currently on hold * Check baseline EKG *Hemodynamically stable  Fever/ Leukocytosis *Fever has resolved *Leukocytosis has resolved *No neck stiffness, HA, or photophobia * As above meningitis is felt to be very unlikely at this time so will dc droplet precautions *Influenza panel PCR negative * Urine culture negative- see below regarding blood cx's- dc antibiotics *Procalcitonin level is normal at 0.10  * observe clinically off abx - if sx recur, will have to undergo LP/repeat infectious eval  ? Bacteremia due to Gram-positive bacteria *One out of two bottles has grown coag negative staph which is likely a contaminant - will observe off abx for now  Hypertension *Blood pressure is soft so usual antihypertensive medications on hold *Was on a beta blocker prior to admission so watch for breakthrough tachycardia from beta blocker withdrawal- actually having bradycardia at this time   Diabetes mellitus type 2, uncontrolled *CBGs have decreased since dextrose IVF changed *Continue Lantus 25 units at hour of sleep and continue moderate sliding scale insulin and 15 units meal coverage *Appreciate diabetes educator assistance *Hemoglobin A1c is 7.3  Dementia *Before admission to skilled nursing facility patient apparently was ambulatory  *Was not on medications prior to admission   ARF (acute renal failure) * Resolved *Seems related to dehydration with BUN and creatinine decreasing after hydration initiated *Continue to follow electrolyte panel  Hypokalemia * Replete   Acute respiratory failure with hypoxia *Resolved *Continue supportive care with oxygen and pulmonary toileting *Repeat chest x-ray after adequate hydration demonstrated no evidence of PNA   Decubitus ulcer of  sacral region, unstageable *Appreciate wound ostomy nurse evaluation  *Air mattress overlay has been  ordered and recommendations are to begin Santyl ointment to chemically debride the area which is covered by 100% eschar. The area measures 5 x 3 cm diameter   DVT prophylaxis: SCDs  Code Status:  DO NOT RESUSCITATE  Family Communication:  no family at bedside at time of evaluation  Disposition Plan:  Transfer to floor  Consultants: Critical care medicine for lumbar puncture procedure - unable to be completed  Procedures: lumbar puncture cancelled  Antibiotics: Rocephin 10/30 >>> 04/08/12 Vancomycin 10/30 >>> 04/08/12 Levaquin 04/08/12 >>> 04/08/12 Zosyn 04/08/12 >>> 04/08/12  HPI/Subjective: Patient much more alert today- asking for food and no evidence or complaints of neck or chest discomfort.   Objective: Blood pressure 111/64, pulse 53, temperature 98.5 F (36.9 C), temperature source Oral, resp. rate 13, height 5\' 11"  (1.803 m), weight 79.7 kg (175 lb 11.3 oz), SpO2 96.00%.  Intake/Output Summary (Last 24 hours) at 04/08/12 1208 Last data filed at 04/08/12 1000  Gross per 24 hour  Intake 2955.5 ml  Output    250 ml  Net 2705.5 ml     Exam: General: No acute respiratory distress Lungs: CTA bilaterally, non labored, no tachypnea, RA Cardiovascular: Regular rate and rhythm without murmur gallop or rub normal S1 and S2, IV fluid at 125 cc per hour  Abdomen: Nontender, nondistended, soft, bowel sounds positive, no rebound, no ascites, no appreciable mass Musculoskeletal: No significant cyanosis, clubbing of bilateral lower extremities Neurological: Alert, oriented to name, moves all extremities x 4 weakly, non focal exam- swallow evaluation pending  Data Reviewed: Basic Metabolic Panel:  Lab 04/08/12 1610 04/07/12 2102 04/07/12 1205 04/07/12 0450 04/06/12 2101  NA 157* 154* 152* 151* 158*  K 3.1* 3.4* 3.4* 3.7 4.0  CL 125* 120* 118* 118* 122*  CO2 23 24 23 21 21   GLUCOSE 212* 166* 361* 398* 260*  BUN 28* 30* 35* 39* 41*  CREATININE 0.88 0.93 1.11 1.19 1.32  CALCIUM  7.9* 8.1* 7.8* 8.1* 8.3*  MG -- -- -- -- --  PHOS -- -- -- -- --   Liver Function Tests:  Lab 04/08/12 0438 04/06/12 0948 04/01/12 1252  AST 28 27 46*  ALT 23 26 28   ALKPHOS 66 60 64  BILITOT 0.2* 0.4 0.3  PROT 5.9* 7.4 7.1  ALBUMIN 1.8* 2.4* 2.4*   CBC:  Lab 04/08/12 0438 04/07/12 0450 04/06/12 0948 04/01/12 1252  WBC 6.7 10.7* 9.9 8.4  NEUTROABS -- -- 6.6 --  HGB 12.2* 14.3 15.8 14.0  HCT 37.6* 44.4 48.0 42.2  MCV 90.4 91.0 90.1 89.8  PLT 209 282 316 258   CBG:  Lab 04/08/12 0738 04/08/12 0449 04/08/12 0103 04/07/12 1930 04/07/12 1632  GLUCAP 175* 182* 163* 145* 229*    Recent Results (from the past 240 hour(s))  URINE CULTURE     Status: Normal   Collection Time   04/06/12  9:55 AM      Component Value Range Status Comment   Specimen Description URINE, CATHETERIZED   Final    Special Requests NONE   Final    Culture  Setup Time 04/06/2012 15:40   Final    Colony Count NO GROWTH   Final    Culture NO GROWTH   Final    Report Status 04/07/2012 FINAL   Final   CULTURE, BLOOD (ROUTINE X 2)     Status: Normal (Preliminary result)   Collection Time  04/06/12 10:00 AM      Component Value Range Status Comment   Specimen Description BLOOD RIGHT ARM   Final    Special Requests BOTTLES DRAWN AEROBIC AND ANAEROBIC 10CC   Final    Culture  Setup Time 04/06/2012 15:22   Final    Culture     Final    Value:        BLOOD CULTURE RECEIVED NO GROWTH TO DATE CULTURE WILL BE HELD FOR 5 DAYS BEFORE ISSUING A FINAL NEGATIVE REPORT   Report Status PENDING   Incomplete   CULTURE, BLOOD (ROUTINE X 2)     Status: Normal   Collection Time   04/06/12 10:12 AM      Component Value Range Status Comment   Specimen Description BLOOD RIGHT ARM   Final    Special Requests BOTTLES DRAWN AEROBIC AND ANAEROBIC 10CC   Final    Culture  Setup Time 04/06/2012 15:23   Final    Culture     Final    Value: STAPHYLOCOCCUS SPECIES (COAGULASE NEGATIVE)     Note: THE SIGNIFICANCE OF ISOLATING THIS  ORGANISM FROM A SINGLE SET OF BLOOD CULTURES WHEN MULTIPLE SETS ARE DRAWN IS UNCERTAIN. PLEASE NOTIFY THE MICROBIOLOGY DEPARTMENT WITHIN ONE WEEK IF SPECIATION AND SENSITIVITIES ARE REQUIRED.     Note: Gram Stain Report Called to,Read Back By and Verified With: TATA CARBONE @0910  04/07/12 BY KRAWS   Report Status 04/08/2012 FINAL   Final   MRSA PCR SCREENING     Status: Normal   Collection Time   04/06/12  9:03 PM      Component Value Range Status Comment   MRSA by PCR NEGATIVE  NEGATIVE Final      Studies:  Recent x-ray studies have been reviewed in detail by the Attending Physician  Scheduled Meds:  Reviewed in detail by the Attending Physician   Junious Silk, ANP [facility] Office  [phone] Pager [phone]  On-Call/Text Page:      [email or web address]      password [email or web address]  If 7PM-7AM, please contact night-coverage [email or web address] Password [email or web address] 04/08/2012, 12:08 PM   LOS: 2 days   I have personally examined this patient and reviewed the entire database. I have reviewed the above note, made any necessary editorial changes, and agree with its content.  Lonia Blood, MD [facility]

## 2012-04-08 NOTE — Evaluation (Signed)
Occupational Therapy Evaluation Patient Details Name: Jonathon Lane MRN: [id number] DOB: [birth year] Today's Date: 04/08/2012 Time: 4098-1191 OT Time Calculation (min): 31 min  OT Assessment / Plan / Recommendation Clinical Impression  78 yr old male admitted with increased lethergy, hyponatremia, and dehydration.  Came from SNF where he was needing total assist for selfcare and transfers per daughter report.  Prior to SNF admissions a couple of months ago, pt was walking with supervision and helping to perform most of his basic selfcare tasks.  Feel he will benefit from trial OT services to help regain some of his independence with basic ADL tasks.  Feel he will need SNF level rehab at discharge to continue progression as well and provide 24 hour assist/supervision.    OT Assessment  Patient needs continued OT Services    Follow Up Recommendations  Skilled nursing facility    Barriers to Discharge None    Equipment Recommendations  None recommended by OT       Frequency  Min 1X/week    Precautions / Restrictions Precautions Precautions: Fall Precaution Comments: history of dementia, hard of hearing Restrictions Weight Bearing Restrictions: No   Pertinent Vitals/Pain Vitals stable, unable to obtain pulse ox reading during session    ADL  Grooming: Performed;Wash/dry face;Set up Where Assessed - Grooming: Supine, head of bed up Upper Body Bathing: Simulated;Maximal assistance Where Assessed - Upper Body Bathing: Supine, head of bed up Lower Body Bathing: Simulated;+1 Total assistance Where Assessed - Lower Body Bathing: Supine, head of bed up;Rolling right and/or left Toileting - Clothing Manipulation and Hygiene: Simulated Where Assessed - Toileting Clothing Manipulation and Hygiene: Rolling right and/or left Transfers/Ambulation Related to ADLs: Not tested ADL Comments: Pt unable to answer therapist's questions regarding prior level of function.  At times would begin  a sentence that would have nothing to do with the question asked.  Limited eval secondary to pt's ability to participate.  Required max coaxing to attempt to sit up on the edge of the bed.  Overall needed total assist for supine to sit and resisted coming into the sitting position.  Once sitting needed max assist to maintain, with pt repeatedly pushing backwards in attempts to lay down.  Inconsistent with following one step commnads throughou session but did wash his face when presented with a washcloth and instructed to do so.  Unable to attempt standing or transfer today.       OT Diagnosis: Generalized weakness;Cognitive deficits;Acute pain;Altered mental status  OT Problem List: Decreased cognition;Decreased strength;Decreased range of motion;Decreased activity tolerance;Impaired balance (sitting and/or standing);Decreased knowledge of use of DME or AE;Pain;Impaired UE functional use OT Treatment Interventions: Self-care/ADL training;DME and/or AE instruction;Balance training;Patient/family education;Cognitive remediation/compensation;Therapeutic activities   OT Goals Acute Rehab OT Goals OT Goal Formulation: Patient unable to participate in goal setting Time For Goal Achievement: 04/22/12 Potential to Achieve Goals: Fair ADL Goals Pt Will Perform Eating: with set-up;with cueing (comment type and amount);Other (comment) (min instructional cueing for initiating) ADL Goal: Eating - Progress: Goal set today Pt Will Perform Grooming: with supervision;Sitting, edge of bed;Unsupported;Other (comment) (2 tasks with min instructional cueing) Pt Will Perform Upper Body Bathing: with supervision;with cueing (comment type and amount);Unsupported;Sitting, edge of bed;Other (comment) (min instructional cueing) ADL Goal: Upper Body Bathing - Progress: Goal set today Pt Will Perform Lower Body Bathing: with max assist;Sit to stand from bed;with cueing (comment type and amount);Other (comment) (min  instructional cueing) ADL Goal: Lower Body Bathing - Progress: Goal set today Pt Will Transfer to  Toilet: with max assist;with DME;3-in-1;Stand pivot transfer ADL Goal: Toilet Transfer - Progress: Goal set today Miscellaneous OT Goals Miscellaneous OT Goal #1: Pt will transition supine to sit EOB in preparation for selfcare tasks with mod facilitation and min instructional cueing. OT Goal: Miscellaneous Goal #1 - Progress: Goal set today  Visit Information  Last OT Received On: 04/08/12 Assistance Needed: +2    Subjective Data  Subjective: "I don't want to do that."  (therapist requesting pt to attempt sitting EOB) Patient Stated Goal: Pt did not state.   Prior Functioning     Home Living Lives With: Other (Comment) Available Help at Discharge: Skilled Nursing Facility Additional Comments: Per daughter pt was at a SNF prior to admission and was basically in a wheelchair, not doing any walking or mobility because he was so heavily medicated.  She reports that he was walking with a walker, helping to perform his ADLS approximately 2 months ago. Prior Function Level of Independence: Needs assistance Needs Assistance: Bathing;Dressing;Grooming;Toileting Bath: Minimal Dressing: Minimal Grooming: Minimal Toileting: Minimal Able to Take Stairs?: No Driving: No Vocation: Retired Musician: Other (comment) (Hard of hearing) Dominant Hand: Left         Vision/Perception Vision - Assessment Vision Assessment: Vision not tested Praxis Praxis: Intact   Cognition  Overall Cognitive Status: History of cognitive impairments - at baseline Arousal/Alertness: Awake/alert Orientation Level: Disoriented X4;Person;Place;Time;Situation Behavior During Session: Agitated Cognition - Other Comments: Pt with history of dementia but unsure if this is his normal baseline or symptoms are worse secondary to environmental change vs progression of disease.    Extremity/Trunk  Assessment Right Upper Extremity Assessment RUE ROM/Strength/Tone: Deficits RUE ROM/Strength/Tone Deficits: Shoulder flexion AROM to approximately 90 degrees.  Elbow flexion/extension 3+/5, grip also 3+/5. RUE Sensation:  (Unable to test/ determine secondary to cognition.) RUE Coordination Deficits: Difficult to adequately assess. Left Upper Extremity Assessment LUE ROM/Strength/Tone: Deficits LUE ROM/Strength/Tone Deficits: Shoulder flexion to approximately 90 degrees.  Elbow flexion/extension 3+/5, grip strength 3+/5 LUE Sensation:  (Unable to test/determine secondary to cognition.) LUE Coordination Deficits: Difficult to adequately assess. Trunk Assessment Trunk Assessment: Normal     Mobility Bed Mobility Bed Mobility: Rolling Left;Supine to Sit Rolling Left: 2: Max assist Supine to Sit: 1: +1 Total assist Details for Bed Mobility Assistance: Pt resistant to transitiongins supine to sit EOB.           Balance Balance Balance Assessed: Yes Static Sitting Balance Static Sitting - Balance Support: Bilateral upper extremity supported Static Sitting - Level of Assistance: 2: Max assist   End of Session OT - End of Session Activity Tolerance: Treatment limited secondary to agitation Patient left: in bed;with call bell/phone within reach;with nursing in room Nurse Communication: Other (comment) (Decreased participation in therapy.)     Jazell Rosenau OTR/L Pager number [phone] 04/08/2012, 4:42 PM

## 2012-04-09 LAB — CBC
HCT: 39.4 % (ref 39.0–52.0)
Platelets: 219 10*3/uL (ref 150–400)
RBC: 4.43 MIL/uL (ref 4.22–5.81)
RDW: 13.6 % (ref 11.5–15.5)
WBC: 5.2 10*3/uL (ref 4.0–10.5)

## 2012-04-09 LAB — GLUCOSE, CAPILLARY
Glucose-Capillary: 162 mg/dL — ABNORMAL HIGH (ref 70–99)
Glucose-Capillary: 157 mg/dL — ABNORMAL HIGH (ref 70–99)

## 2012-04-09 LAB — BASIC METABOLIC PANEL
CO2: 23 mEq/L (ref 19–32)
Calcium: 7.9 mg/dL — ABNORMAL LOW (ref 8.4–10.5)
Creatinine, Ser: 0.83 mg/dL (ref 0.50–1.35)
GFR calc Af Amer: >90 mL/min (ref >90)
Potassium: 3 mEq/L — ABNORMAL LOW (ref 3.5–5.1)

## 2012-04-09 LAB — VITAMIN B12: Vitamin B-12: 1814 pg/mL — ABNORMAL HIGH (ref 211–911)

## 2012-04-09 MED ORDER — WHITE PETROLATUM GEL
Status: AC
  Administered 2012-04-09: 15:00:00
  Filled 2012-04-09: qty 5

## 2012-04-09 MED ORDER — SODIUM CHLORIDE 0.45 % IV SOLN
INTRAVENOUS | Status: DC
  Administered 2012-04-09 – 2012-04-10 (×3): via INTRAVENOUS
  Filled 2012-04-09 (×9): qty 1000

## 2012-04-09 NOTE — Progress Notes (Signed)
[facility] PROGRESS NOTE  Jonathon Lane OZH:[id number] DOB: [birth year] DOA: 04/06/2012 PCP: No primary provider on file.  Assessment/Plan: Principal Problem:  *Dehydration with hypernatremia Active Problems:  Altered mental state  Hypertension  Diabetes mellitus type 2, uncontrolled  Dementia  Fever  ARF (acute renal failure)  Acute respiratory failure with hypoxia  Bacteremia due to Gram-positive bacteria  Nuchal rigidity  Leukocytosis  Decubitus ulcer of sacral region, unstageable  Sinus bradycardia  Hypokalemia  Brief narrative:  Jonathon Lane is a 78 y.o. male with past medical history of diabetes mellitus, dementia and hypertension. He was brought to the hospital from his nursing home because of altered mental status. His baseline is awake and alert, about a week ago the nursing home staff noticed that he is more lethargic with poor oral intake. He was brought to the emergency department and evaluated on 04/01/2012, according to the notes his lethargy resolved and he was back to his baseline. That was believed to be secondary to Depakote. Since then the patient was not eating or drinking very well, according to his wife in the past 2 days he was getting more lethargic so he was brought back to the emergency department for further evaluation. Upon initial evaluation in the emergency department patient only responds to painful stimuli, but he prefers to keep his eyes closed. His sodium level is 154, creatinine is 1.5 and he has normal WBC count of 9.9.    Assessment/Plan:  Dehydration with hypernatremia  *Sodium has trended back upward with NS so will change to 1/2 NS along with KCl today- still seems quite volume repleted  *Follow electrolytes  Altered mental state  *Mental status has markedly improved-now talking and requesting food  *Seems to have multifactorial etiology related to dehydration with hypernatremia as well as recent fever and infectious processes  *Given  reported nuchal rigidity type symptoms at presentation meningitis was in the differential- unfortunately has received 48 hrs+ of antibiotics and LP has not yet been done. Dr. Sharon Lane has reviewed the HPI and given rapid improvement no indications to pursue meningitis work up so will dc LP as the results would likely be very low yield at this time  *Have requested ST swallowing study before initiating orals  Sinus Bradycardia  * Was on beta blockers prior to admit- currently on hold  * Check baseline EKG  *Hemodynamically stable  Fever/ Leukocytosis  *Fever has resolved  *Leukocytosis has resolved  *No neck stiffness, HA, or photophobia  * As above meningitis is felt to be very unlikely at this time so will dc droplet precautions  *Influenza panel PCR negative  * Urine culture negative- see below regarding blood cx's- dc antibiotics  *Procalcitonin level is normal at 0.10  * observe clinically off abx - if sx recur, will have to undergo LP/repeat infectious eval  ? Bacteremia due to Gram-positive bacteria  *One out of two bottles has grown coag negative staph which is likely a contaminant - will observe off abx for now  Hypertension  *Blood pressure is soft so usual antihypertensive medications on hold  *Was on a beta blocker prior to admission so watch for breakthrough tachycardia from beta blocker withdrawal- actually having bradycardia at this time  Diabetes mellitus type 2, uncontrolled  *CBGs have decreased since dextrose IVF changed  *Continue Lantus 25 units at hour of sleep and continue moderate sliding scale insulin and 15 units meal coverage  *Appreciate diabetes educator assistance  *Hemoglobin A1c is 7.3  Dementia  *Before admission  to skilled nursing facility patient apparently was ambulatory  *Was not on medications prior to admission  ARF (acute renal failure)  * Resolved  *Seems related to dehydration with BUN and creatinine decreasing after hydration initiated    *Continue to follow electrolyte panel  Hypokalemia  * Replete  Acute respiratory failure with hypoxia  *Resolved  *Continue supportive care with oxygen and pulmonary toileting  *Repeat chest x-ray after adequate hydration demonstrated no evidence of PNA  Decubitus ulcer of sacral region, unstageable  *Appreciate wound ostomy nurse evaluation  *Air mattress overlay has been ordered and recommendations are to begin Santyl ointment to chemically debride the area which is covered by 100% eschar. The area measures 5 x 3 cm diameter    DVT prophylaxis: SCDs  Code Status: DO NOT RESUSCITATE  Family Communication: no family at bedside at time of evaluation  Disposition Plan: Transfer to floor  Consultants:  Critical care medicine for lumbar puncture procedure - unable to be completed  Procedures:  lumbar puncture cancelled  Antibiotics:  Rocephin 10/30 >>> 04/08/12  Vancomycin 10/30 >>> 04/08/12  Levaquin 04/08/12 >>> 04/08/12  Zosyn 04/08/12 >>> 04/08/12  HPI/Subjective:  Patient much more alert today-  Objective: Filed Vitals:   04/08/12 1132 04/08/12 1630 04/08/12 2100 04/09/12 0630  BP: 111/64 125/61 120/68 127/57  Pulse: 53 57 58 62  Temp: 98.5 F (36.9 C) 98.5 F (36.9 C) 98.1 F (36.7 C) 98.4 F (36.9 C)  TempSrc: Oral Oral Oral Oral  Resp: 13 18 18 16   Height:      Weight:      SpO2: 96% 98% 98% 99%    Intake/Output Summary (Last 24 hours) at 04/09/12 0906 Last data filed at 04/09/12 0700  Gross per 24 hour  Intake 3640.5 ml  Output    800 ml  Net 2840.5 ml    Exam:  General: No acute respiratory distress  Lungs: CTA bilaterally, non labored, no tachypnea, RA  Cardiovascular: Regular rate and rhythm without murmur gallop or rub normal S1 and S2, IV fluid at 125 cc per hour  Abdomen: Nontender, nondistended, soft, bowel sounds positive, no rebound, no ascites, no appreciable mass  Musculoskeletal: No significant cyanosis, clubbing of bilateral lower extremities   Neurological: Alert, oriented to name, moves all extremities x 4 weakly, non focal exam- swallow evaluation pending    Data Reviewed: Basic Metabolic Panel:  Lab 04/09/12 1610 04/08/12 0438 04/07/12 2102 04/07/12 1205 04/07/12 0450  NA 152* 157* 154* 152* 151*  K 3.0* 3.1* 3.4* 3.4* 3.7  CL 117* 125* 120* 118* 118*  CO2 23 23 24 23 21   GLUCOSE 136* 212* 166* 361* 398*  BUN 23 28* 30* 35* 39*  CREATININE 0.83 0.88 0.93 1.11 1.19  CALCIUM 7.9* 7.9* 8.1* 7.8* 8.1*  MG -- -- -- -- --  PHOS -- -- -- -- --    Liver Function Tests:  Lab 04/08/12 0438 04/06/12 0948  AST 28 27  ALT 23 26  ALKPHOS 66 60  BILITOT 0.2* 0.4  PROT 5.9* 7.4  ALBUMIN 1.8* 2.4*   No results found for this basename: LIPASE:5,AMYLASE:5 in the last 168 hours No results found for this basename: AMMONIA:5 in the last 168 hours  CBC:  Lab 04/09/12 0107 04/08/12 0438 04/07/12 0450 04/06/12 0948  WBC 5.2 6.7 10.7* 9.9  NEUTROABS -- -- -- 6.6  HGB 12.8* 12.2* 14.3 15.8  HCT 39.4 37.6* 44.4 48.0  MCV 88.9 90.4 91.0 90.1  PLT 219 209 282  316    Cardiac Enzymes: No results found for this basename: CKTOTAL:5,CKMB:5,CKMBINDEX:5,TROPONINI:5 in the last 168 hours BNP (last 3 results) No results found for this basename: PROBNP:3 in the last 8760 hours   CBG:  Lab 04/09/12 0801 04/08/12 2142 04/08/12 1600 04/08/12 1138 04/08/12 0738  GLUCAP 104* 133* 173* 215* 175*    Recent Results (from the past 240 hour(s))  URINE CULTURE     Status: Normal   Collection Time   04/06/12  9:55 AM      Component Value Range Status Comment   Specimen Description URINE, CATHETERIZED   Final    Special Requests NONE   Final    Culture  Setup Time 04/06/2012 15:40   Final    Colony Count NO GROWTH   Final    Culture NO GROWTH   Final    Report Status 04/07/2012 FINAL   Final   CULTURE, BLOOD (ROUTINE X 2)     Status: Normal (Preliminary result)   Collection Time   04/06/12 10:00 AM      Component Value Range Status  Comment   Specimen Description BLOOD RIGHT ARM   Final    Special Requests BOTTLES DRAWN AEROBIC AND ANAEROBIC 10CC   Final    Culture  Setup Time 04/06/2012 15:22   Final    Culture     Final    Value:        BLOOD CULTURE RECEIVED NO GROWTH TO DATE CULTURE WILL BE HELD FOR 5 DAYS BEFORE ISSUING A FINAL NEGATIVE REPORT   Report Status PENDING   Incomplete   CULTURE, BLOOD (ROUTINE X 2)     Status: Normal   Collection Time   04/06/12 10:12 AM      Component Value Range Status Comment   Specimen Description BLOOD RIGHT ARM   Final    Special Requests BOTTLES DRAWN AEROBIC AND ANAEROBIC 10CC   Final    Culture  Setup Time 04/06/2012 15:23   Final    Culture     Final    Value: STAPHYLOCOCCUS SPECIES (COAGULASE NEGATIVE)     Note: THE SIGNIFICANCE OF ISOLATING THIS ORGANISM FROM A SINGLE SET OF BLOOD CULTURES WHEN MULTIPLE SETS ARE DRAWN IS UNCERTAIN. PLEASE NOTIFY THE MICROBIOLOGY DEPARTMENT WITHIN ONE WEEK IF SPECIATION AND SENSITIVITIES ARE REQUIRED.     Note: Gram Stain Report Called to,Read Back By and Verified With: TATA CARBONE @0910  04/07/12 BY KRAWS   Report Status 04/08/2012 FINAL   Final   MRSA PCR SCREENING     Status: Normal   Collection Time   04/06/12  9:03 PM      Component Value Range Status Comment   MRSA by PCR NEGATIVE  NEGATIVE Final      Studies: Ct Head Wo Contrast  04/07/2012  *RADIOLOGY REPORT*  Clinical Data: Altered mental status and confusion  CT HEAD WITHOUT CONTRAST  Technique:  Contiguous axial images were obtained from the base of the skull through the vertex without contrast.  Comparison: CT 04/01/2012  Findings: Moderately severe atrophy.  Diffuse ventricular enlargement is unchanged and most compatible with central atrophy.  No acute infarct.  Negative for hemorrhage or mass lesion.  No fluid collection.  Extensive carotid atherosclerotic calcification. Bilateral vertebral calcification.  No skull lesion.  IMPRESSION: Ventricular enlargement, compatible  with atrophy.  No acute abnormality and no interval change.   Original Report Authenticated By: Janeece Riggers, M.D.    Ct Head Wo Contrast  04/01/2012  *RADIOLOGY REPORT*  Clinical Data: Pain.  CT HEAD WITHOUT CONTRAST  Technique:  Contiguous axial images were obtained from the base of the skull through the vertex without contrast.  Comparison: Head CT scan 03/05/2012.  Findings: There is some chronic microvascular ischemic change and cortical atrophy.  No evidence of acute abnormality including infarct, hemorrhage, mass lesion, mass effect, midline shift or abnormal extra-axial fluid collection.  No hydrocephalus or pneumocephalus.  The calvarium is intact.  Atherosclerosis is noted.  IMPRESSION: No acute finding.  Stable compared to prior exam.   Original Report Authenticated By: Bernadene Bell. Maricela Curet, M.D.    Dg Chest Port 1 View  04/07/2012  *RADIOLOGY REPORT*  Clinical Data: Weakness, pneumonia  PORTABLE CHEST - 1 VIEW  Comparison: Chest radiograph 04/06/2012  Findings: Sternotomy wires overlie normal cardiac silhouette.  No effusion, infiltrate, or pneumothorax.  Mild basilar atelectasis on the left. No acute osseous abnormality.  IMPRESSION: Mild left basilar atelectasis.  No evidence of pneumonia.   Original Report Authenticated By: Genevive Bi, M.D.    Dg Chest Port 1 View  04/06/2012  *RADIOLOGY REPORT*  Clinical Data: Fever  PORTABLE CHEST - 1 VIEW  Comparison: None.  Findings: Cardiomediastinal silhouette is unremarkable.  Status post CABG.  No acute infiltrate or pleural effusion.  No pulmonary edema.  Degenerative changes right AC joint.  IMPRESSION: .  No active disease.  Status post CABG.   Original Report Authenticated By: Natasha Mead, M.D.     Scheduled Meds:   . antiseptic oral rinse  15 mL Mouth Rinse q12n4p  . chlorhexidine  15 mL Mouth Rinse BID  . collagenase   Topical Daily  . insulin aspart  0-15 Units Subcutaneous TID WC  . insulin glargine  25 Units Subcutaneous QHS  .  potassium chloride  40 mEq Oral BID  . sodium chloride  3 mL Intravenous Q12H  . DISCONTD: levofloxacin (LEVAQUIN) IV  750 mg Intravenous Q24H  . DISCONTD: piperacillin-tazobactam (ZOSYN)  IV  3.375 g Intravenous Q8H  . DISCONTD: vancomycin  1,000 mg Intravenous Q12H   Continuous Infusions:   . sodium chloride 150 mL/hr at 04/09/12 1610    Principal Problem:  *Dehydration with hypernatremia Active Problems:  Altered mental state  Hypertension  Diabetes mellitus type 2, uncontrolled  Dementia  Fever  ARF (acute renal failure)  Acute respiratory failure with hypoxia  Bacteremia due to Gram-positive bacteria  Nuchal rigidity  Leukocytosis  Decubitus ulcer of sacral region, unstageable  Sinus bradycardia  Hypokalemia    Time spent: 40 minutes   [facility]  [facility] Pager [phone]. If 8PM-8AM, please contact night-coverage at [email or web address], password [facility] 04/09/2012, 9:06 AM  LOS: 3 days

## 2012-04-09 NOTE — Evaluation (Signed)
Physical Therapy Evaluation Patient Details Name: Jonathon Lane MRN: [id number] DOB: 13-Mar-1934 Today's Date: 04/09/2012 Time: 4098-1191 PT Time Calculation (min): 9 min  PT Assessment / Plan / Recommendation Clinical Impression  Pt admitted for dehydration and hypernatremia. Pt admitted from [facility] per CSW in chart. Attempted to contact [facility] Holes but was place on hold and received no response. Per OT note from dgtr pt was at [facility] prior to admission. Pt currently not following commands or participating with therapy and is not currently appropriate for acute P.T. Recommend SNF at discharge for long term placement with further therapy needs deferred to facility. Should pt status change and able to participate please reorder.     PT Assessment  Patent does not need any further PT services    Follow Up Recommendations  Post acute inpatient    Does the patient have the potential to tolerate intense rehabilitation   No, Recommend SNF  Barriers to Discharge        Equipment Recommendations  None recommended by PT    Recommendations for Other Services     Frequency      Precautions / Restrictions     Pertinent Vitals/Pain PAINAD= 0      Mobility  Bed Mobility Bed Mobility: Scooting to HOB Scooting to HOB: 1: +2 Total assist Scooting to [facility]: Patient Percentage: 0% Details for Bed Mobility Assistance: Pt resistant to attempts at rolling or sitting. Pt would participate with lifting RUE on command x 1 and performed heel slide bilaterally x 1 AROM but with attempts at repetition or AAROM pt resisted flexion with forceful extension. Attempted rolling right was able to get pt to bend his knee and rotate pelvis but would not reach for rail or attempt further rolling with max assist.  Transfers Transfers: Not assessed Ambulation/Gait Ambulation/Gait Assistance: Not tested (comment)    Shoulder Instructions     Exercises     PT Diagnosis:    PT Problem List:   PT Treatment  Interventions:     PT Goals    Visit Information  Last PT Received On: 04/09/12 Assistance Needed: +2    Subjective Data  Subjective: "I don't want to do that" Patient Stated Goal: pt would not state   Prior Functioning  Home Living Lives With: Other (Comment) (was at [facility]) Available Help at Discharge: Skilled Nursing Facility Additional Comments: Per dgtr to OT pt was at SNF basically in a WC not walking or mobilizing because of medication. Pt was walking with walker and assisting with ADLS 2 months ago Prior Function Level of Independence: Needs assistance Needs Assistance: Transfers Transfer Assistance: total Able to Take Stairs?: No Driving: No Vocation: Retired    IT consultant  Overall Cognitive Status: History of cognitive impairments - at baseline Arousal/Alertness: Awake/alert Orientation Level: Disoriented X4 Behavior During Session: Agitated Cognition - Other Comments: Pt with dementia and would not follow commands during session or even provide his name    Extremity/Trunk Assessment Right Lower Extremity Assessment RLE ROM/Strength/Tone: Unable to fully assess;Due to impaired cognition (knee flexion grossly 2+/5) Left Lower Extremity Assessment LLE ROM/Strength/Tone: Unable to fully assess;Due to impaired cognition (knee flexion grossly 2+/5)   Balance    End of Session PT - End of Session Activity Tolerance: Treatment limited secondary to agitation;Other (comment) (Pt not participating and resistive) Patient left: in bed;with bed alarm set Nurse Communication: Mobility status  GP     Delorse Lek 04/09/2012, 11:20 AM  Delaney Meigs, PT [phone]

## 2012-04-10 ENCOUNTER — Inpatient Hospital Stay (HOSPITAL_COMMUNITY): Payer: Medicare Other

## 2012-04-10 LAB — GLUCOSE, CAPILLARY: Glucose-Capillary: 175 mg/dL — ABNORMAL HIGH (ref 70–99)

## 2012-04-10 LAB — CBC
MCH: 29.4 pg (ref 26.0–34.0)
MCV: 85.7 fL (ref 78.0–100.0)
Platelets: 227 10*3/uL (ref 150–400)
RDW: 13.2 % (ref 11.5–15.5)

## 2012-04-10 LAB — OCCULT BLOOD X 1 CARD TO LAB, STOOL: Fecal Occult Bld: POSITIVE

## 2012-04-10 MED ORDER — IOHEXOL 300 MG/ML  SOLN
100.0000 mL | Freq: Once | INTRAMUSCULAR | Status: AC | PRN
  Administered 2012-04-10: 100 mL via INTRAVENOUS

## 2012-04-10 MED ORDER — SACCHAROMYCES BOULARDII 250 MG PO CAPS
250.0000 mg | ORAL_CAPSULE | Freq: Two times a day (BID) | ORAL | Status: DC
  Administered 2012-04-10 – 2012-04-15 (×7): 250 mg via ORAL
  Filled 2012-04-10 (×12): qty 1

## 2012-04-10 MED ORDER — METRONIDAZOLE 500 MG PO TABS
500.0000 mg | ORAL_TABLET | Freq: Three times a day (TID) | ORAL | Status: DC
  Filled 2012-04-10 (×4): qty 1

## 2012-04-10 NOTE — Progress Notes (Addendum)
[facility] PROGRESS NOTE  Jonathon Lane RUE:[id number] DOB: Jun 07, 1934 DOA: 04/06/2012 PCP: No primary provider on file.  Assessment/Plan: Principal Problem:  *Dehydration with hypernatremia Active Problems:  Altered mental state  Hypertension  Diabetes mellitus type 2, uncontrolled  Dementia  Fever  ARF (acute renal failure)  Acute respiratory failure with hypoxia  Bacteremia due to Gram-positive bacteria  Nuchal rigidity  Leukocytosis  Decubitus ulcer of sacral region, unstageable  Sinus bradycardia  Hypokalemia    Brief narrative:  Jonathon Lane is a 78 y.o. male with past medical history of diabetes mellitus, dementia and hypertension. He was brought to the hospital from his nursing home because of altered mental status. His baseline is awake and alert, about a week ago the nursing home staff noticed that he is more lethargic with poor oral intake. He was brought to the emergency department and evaluated on 04/01/2012, according to the notes his lethargy resolved and he was back to his baseline. That was believed to be secondary to Depakote. Since then the patient was not eating or drinking very well, according to his wife in the past 2 days he was getting more lethargic so he was brought back to the emergency department for further evaluation. Upon initial evaluation in the emergency department patient only responds to painful stimuli, but he prefers to keep his eyes closed. His sodium level is 154, creatinine is 1.5 and he has normal WBC count of 9.9.    Assessment/Plan:  Dehydration with hypernatremia  *Sodium has trended back upward with NS so will change to 1/2 NS along with KCl yesterday, follow BMP still seems quite volume repleted  *Follow electrolytes   Diarrhea/ abdominal pain 5-6 bowel movements yesterday, C. difficile PCR pending at this time Will empirically start oral Flagyl to see the patient improves, pt poor historain and complaining of abdominal  pain, Will obtain CT abdomen today to assess  Altered mental state  *Mental status has markedly improved-still not very conversant *Seems to have multifactorial etiology related to dehydration with hypernatremia as well as recent fever and infectious processes  *Given reported nuchal rigidity type symptoms at presentation meningitis was in the differential-received 48 hrs+ of antibiotics and LP has not yet been done. Dr. Sharon Seller has reviewed the HPI and given rapid improvement no indications to pursue meningitis work up so will dc LP as the results would likely be very low yield at this time  *Have requested ST swallowing study before initiating orals    Sinus Bradycardia  * Was on beta blockers prior to admit- currently on hold  * Check baseline EKG  *Hemodynamically stable    Fever/ Leukocytosis  *Fever has resolved  *Leukocytosis has resolved  *No neck stiffness, HA, or photophobia  * As above meningitis is felt to be very unlikely at this time so will dc droplet precautions  *Influenza panel PCR negative  * Urine culture negative- see below regarding blood cx's- dc antibiotics  *Procalcitonin level is normal at 0.10  * observe clinically off abx - if sx recur, will have to undergo LP/repeat infectious eval    ? Bacteremia due to Gram-positive bacteria  *One out of two bottles has grown coag negative staph which is likely a contaminant - will observe off abx for now    Hypertension  *Blood pressure is soft so usual antihypertensive medications on hold  *Was on a beta blocker prior to admission so watch for breakthrough tachycardia from beta blocker withdrawal- actually having bradycardia at this time  Diabetes mellitus type 2, uncontrolled  Continue Lantus 25 units at hour of sleep and continue moderate sliding scale insulin and 15 units meal coverage  *Appreciate diabetes educator assistance  *Hemoglobin A1c is 7.3    Dementia  *Before admission to skilled nursing  facility patient apparently was ambulatory  *Was not on medications prior to admission   ARF (acute renal failure) next  Resolved , creatinine 1.5 to upon admission down to 0.83 *Seems related to dehydration with BUN and creatinine decreasing after hydration initiated  *Continue to follow electrolyte panel   Hypokalemia  * Replete   Acute respiratory failure with hypoxia  *Resolved  *Continue supportive care with oxygen and pulmonary toileting  *Repeat chest x-ray after adequate hydration demonstrated no evidence of PNA    Decubitus ulcer of sacral region, unstageable  *Appreciate wound ostomy nurse evaluation  *Air mattress overlay has been ordered and recommendations are to begin Santyl ointment to chemically debride the area which is covered by 100% eschar. The area measures 5 x 3 cm diameter    DVT prophylaxis: SCDs  Code Status: DO NOT RESUSCITATE  Family Communication: no family at bedside at time of evaluation  Disposition Plan: Possible discharge to SNF on Monday if diarrhea resolves  Consultants:  Critical care medicine for lumbar puncture procedure - unable to be completed  Procedures:  lumbar puncture cancelled  Antibiotics:  Rocephin 10/30 >>> 04/08/12  Vancomycin 10/30 >>> 04/08/12  Levaquin 04/08/12 >>> 04/08/12  Zosyn 04/08/12 >>> 04/08/12   HPI/Subjective:  Patient much more alert today- but nonverbal    Objective: Filed Vitals:   04/09/12 0630 04/09/12 1405 04/09/12 2224 04/10/12 0644  BP: 127/57 104/60 137/66 137/97  Pulse: 62 71 72 83  Temp: 98.4 F (36.9 C) 98.1 F (36.7 C) 98.3 F (36.8 C) 98.3 F (36.8 C)  TempSrc: Oral Oral Oral Oral  Resp: 16 18 17 18   Height:      Weight:      SpO2: 99% 100% 92% 97%    Intake/Output Summary (Last 24 hours) at 04/10/12 0943 Last data filed at 04/10/12 0600  Gross per 24 hour  Intake   3126 ml  Output   1475 ml  Net   1651 ml    Exam:  General: No acute respiratory distress  Lungs: CTA  bilaterally, non labored, no tachypnea, RA  Cardiovascular: Regular rate and rhythm without murmur gallop or rub normal S1 and S2, IV fluid at 125 cc per hour  Abdomen: Nontender, nondistended, soft, bowel sounds positive, no rebound, no ascites, no appreciable mass  Musculoskeletal: No significant cyanosis, clubbing of bilateral lower extremities  Neurological: Alert, oriented to name, moves all extremities x 4 weakly, non focal exam- swallow evaluation pending    Data Reviewed: Basic Metabolic Panel:  Lab 04/09/12 4098 04/08/12 0438 04/07/12 2102 04/07/12 1205 04/07/12 0450  NA 152* 157* 154* 152* 151*  K 3.0* 3.1* 3.4* 3.4* 3.7  CL 117* 125* 120* 118* 118*  CO2 23 23 24 23 21   GLUCOSE 136* 212* 166* 361* 398*  BUN 23 28* 30* 35* 39*  CREATININE 0.83 0.88 0.93 1.11 1.19  CALCIUM 7.9* 7.9* 8.1* 7.8* 8.1*  MG -- -- -- -- --  PHOS -- -- -- -- --    Liver Function Tests:  Lab 04/08/12 0438 04/06/12 0948  AST 28 27  ALT 23 26  ALKPHOS 66 60  BILITOT 0.2* 0.4  PROT 5.9* 7.4  ALBUMIN 1.8* 2.4*   No results found  for this basename: LIPASE:5,AMYLASE:5 in the last 168 hours No results found for this basename: AMMONIA:5 in the last 168 hours  CBC:  Lab 04/09/12 0107 04/08/12 0438 04/07/12 0450 04/06/12 0948  WBC 5.2 6.7 10.7* 9.9  NEUTROABS -- -- -- 6.6  HGB 12.8* 12.2* 14.3 15.8  HCT 39.4 37.6* 44.4 48.0  MCV 88.9 90.4 91.0 90.1  PLT 219 209 282 316    Cardiac Enzymes: No results found for this basename: CKTOTAL:5,CKMB:5,CKMBINDEX:5,TROPONINI:5 in the last 168 hours BNP (last 3 results) No results found for this basename: PROBNP:3 in the last 8760 hours   CBG:  Lab 04/09/12 2220 04/09/12 1717 04/09/12 1154 04/09/12 0801 04/08/12 2142  GLUCAP 157* 162* 154* 104* 133*    Recent Results (from the past 240 hour(s))  URINE CULTURE     Status: Normal   Collection Time   04/06/12  9:55 AM      Component Value Range Status Comment   Specimen Description URINE,  CATHETERIZED   Final    Special Requests NONE   Final    Culture  Setup Time 04/06/2012 15:40   Final    Colony Count NO GROWTH   Final    Culture NO GROWTH   Final    Report Status 04/07/2012 FINAL   Final   CULTURE, BLOOD (ROUTINE X 2)     Status: Normal (Preliminary result)   Collection Time   04/06/12 10:00 AM      Component Value Range Status Comment   Specimen Description BLOOD RIGHT ARM   Final    Special Requests BOTTLES DRAWN AEROBIC AND ANAEROBIC 10CC   Final    Culture  Setup Time 04/06/2012 15:22   Final    Culture     Final    Value:        BLOOD CULTURE RECEIVED NO GROWTH TO DATE CULTURE WILL BE HELD FOR 5 DAYS BEFORE ISSUING A FINAL NEGATIVE REPORT   Report Status PENDING   Incomplete   CULTURE, BLOOD (ROUTINE X 2)     Status: Normal   Collection Time   04/06/12 10:12 AM      Component Value Range Status Comment   Specimen Description BLOOD RIGHT ARM   Final    Special Requests BOTTLES DRAWN AEROBIC AND ANAEROBIC 10CC   Final    Culture  Setup Time 04/06/2012 15:23   Final    Culture     Final    Value: STAPHYLOCOCCUS SPECIES (COAGULASE NEGATIVE)     Note: THE SIGNIFICANCE OF ISOLATING THIS ORGANISM FROM A SINGLE SET OF BLOOD CULTURES WHEN MULTIPLE SETS ARE DRAWN IS UNCERTAIN. PLEASE NOTIFY THE MICROBIOLOGY DEPARTMENT WITHIN ONE WEEK IF SPECIATION AND SENSITIVITIES ARE REQUIRED.     Note: Gram Stain Report Called to,Read Back By and Verified With: TATA CARBONE @0910  04/07/12 BY KRAWS   Report Status 04/08/2012 FINAL   Final   MRSA PCR SCREENING     Status: Normal   Collection Time   04/06/12  9:03 PM      Component Value Range Status Comment   MRSA by PCR NEGATIVE  NEGATIVE Final      Studies: Ct Head Wo Contrast  04/07/2012  *RADIOLOGY REPORT*  Clinical Data: Altered mental status and confusion  CT HEAD WITHOUT CONTRAST  Technique:  Contiguous axial images were obtained from the base of the skull through the vertex without contrast.  Comparison: CT 04/01/2012   Findings: Moderately severe atrophy.  Diffuse ventricular enlargement is unchanged and most  compatible with central atrophy.  No acute infarct.  Negative for hemorrhage or mass lesion.  No fluid collection.  Extensive carotid atherosclerotic calcification. Bilateral vertebral calcification.  No skull lesion.  IMPRESSION: Ventricular enlargement, compatible with atrophy.  No acute abnormality and no interval change.   Original Report Authenticated By: Janeece Riggers, M.D.    Ct Head Wo Contrast  04/01/2012  *RADIOLOGY REPORT*  Clinical Data: Pain.  CT HEAD WITHOUT CONTRAST  Technique:  Contiguous axial images were obtained from the base of the skull through the vertex without contrast.  Comparison: Head CT scan 03/05/2012.  Findings: There is some chronic microvascular ischemic change and cortical atrophy.  No evidence of acute abnormality including infarct, hemorrhage, mass lesion, mass effect, midline shift or abnormal extra-axial fluid collection.  No hydrocephalus or pneumocephalus.  The calvarium is intact.  Atherosclerosis is noted.  IMPRESSION: No acute finding.  Stable compared to prior exam.   Original Report Authenticated By: Bernadene Bell. Maricela Curet, M.D.    Dg Chest Port 1 View  04/07/2012  *RADIOLOGY REPORT*  Clinical Data: Weakness, pneumonia  PORTABLE CHEST - 1 VIEW  Comparison: Chest radiograph 04/06/2012  Findings: Sternotomy wires overlie normal cardiac silhouette.  No effusion, infiltrate, or pneumothorax.  Mild basilar atelectasis on the left. No acute osseous abnormality.  IMPRESSION: Mild left basilar atelectasis.  No evidence of pneumonia.   Original Report Authenticated By: Genevive Bi, M.D.    Dg Chest Port 1 View  04/06/2012  *RADIOLOGY REPORT*  Clinical Data: Fever  PORTABLE CHEST - 1 VIEW  Comparison: None.  Findings: Cardiomediastinal silhouette is unremarkable.  Status post CABG.  No acute infiltrate or pleural effusion.  No pulmonary edema.  Degenerative changes right AC joint.   IMPRESSION: .  No active disease.  Status post CABG.   Original Report Authenticated By: Natasha Mead, M.D.     Scheduled Meds:   . antiseptic oral rinse  15 mL Mouth Rinse q12n4p  . chlorhexidine  15 mL Mouth Rinse BID  . collagenase   Topical Daily  . insulin aspart  0-15 Units Subcutaneous TID WC  . insulin glargine  25 Units Subcutaneous QHS  . potassium chloride  40 mEq Oral BID  . sodium chloride  3 mL Intravenous Q12H  . [COMPLETED] white petrolatum       Continuous Infusions:   . sodium chloride 0.45 % with kcl 125 mL/hr at 04/09/12 1742    Principal Problem:  *Dehydration with hypernatremia Active Problems:  Altered mental state  Hypertension  Diabetes mellitus type 2, uncontrolled  Dementia  Fever  ARF (acute renal failure)  Acute respiratory failure with hypoxia  Bacteremia due to Gram-positive bacteria  Nuchal rigidity  Leukocytosis  Decubitus ulcer of sacral region, unstageable  Sinus bradycardia  Hypokalemia    Time spent: 40 minutes   [facility]  [facility] Pager [phone]. If 8PM-8AM, please contact night-coverage at [email or web address], password [facility] 04/10/2012, 9:43 AM  LOS: 4 days

## 2012-04-10 NOTE — Progress Notes (Signed)
Foley adjusted per Radiologist, see CT result.  Foley drained 700cc immedistely after readjustment

## 2012-04-11 ENCOUNTER — Inpatient Hospital Stay (HOSPITAL_COMMUNITY): Payer: Medicare Other

## 2012-04-11 DIAGNOSIS — E860 Dehydration: Secondary | ICD-10-CM

## 2012-04-11 DIAGNOSIS — R933 Abnormal findings on diagnostic imaging of other parts of digestive tract: Secondary | ICD-10-CM

## 2012-04-11 LAB — GLUCOSE, CAPILLARY
Glucose-Capillary: 128 mg/dL — ABNORMAL HIGH (ref 70–99)
Glucose-Capillary: 116 mg/dL — ABNORMAL HIGH (ref 70–99)

## 2012-04-11 LAB — PROCALCITONIN: Procalcitonin: <0.1 ng/mL

## 2012-04-11 LAB — COMPREHENSIVE METABOLIC PANEL
Albumin: 1.9 g/dL — ABNORMAL LOW (ref 3.5–5.2)
BUN: 8 mg/dL (ref 6–23)
Chloride: 112 mEq/L (ref 96–112)
Creatinine, Ser: 0.81 mg/dL (ref 0.50–1.35)
GFR calc Af Amer: >90 mL/min (ref >90)
Total Bilirubin: 0.2 mg/dL — ABNORMAL LOW (ref 0.3–1.2)

## 2012-04-11 LAB — CBC
MCHC: 33.1 g/dL (ref 30.0–36.0)
RDW: 13.3 % (ref 11.5–15.5)

## 2012-04-11 LAB — RPR: RPR Ser Ql: NONREACTIVE

## 2012-04-11 LAB — SEDIMENTATION RATE: Sed Rate: 54 mm/hr — ABNORMAL HIGH (ref 0–16)

## 2012-04-11 LAB — C-REACTIVE PROTEIN: CRP: 3.4 mg/dL — ABNORMAL HIGH (ref <0.60)

## 2012-04-11 MED ORDER — CIPROFLOXACIN IN D5W 400 MG/200ML IV SOLN
400.0000 mg | Freq: Two times a day (BID) | INTRAVENOUS | Status: DC
  Administered 2012-04-11 – 2012-04-14 (×7): 400 mg via INTRAVENOUS
  Filled 2012-04-11 (×9): qty 200

## 2012-04-11 MED ORDER — METRONIDAZOLE IN NACL 5-0.79 MG/ML-% IV SOLN
500.0000 mg | Freq: Three times a day (TID) | INTRAVENOUS | Status: DC
Start: 2012-04-11 — End: 2012-04-14
  Administered 2012-04-11 – 2012-04-14 (×9): 500 mg via INTRAVENOUS
  Filled 2012-04-11 (×12): qty 100

## 2012-04-11 MED ORDER — SODIUM CHLORIDE 0.45 % IV SOLN
INTRAVENOUS | Status: DC
  Administered 2012-04-11 – 2012-04-13 (×2): via INTRAVENOUS
  Filled 2012-04-11 (×11): qty 1000

## 2012-04-11 NOTE — Progress Notes (Signed)
[facility]  Patient Status: Active (receiving services up to time of hospitalization)  [facility] is providing the following services: RN  If patient discharges after hours, please call [phone].   Jodene Nam 04/11/2012, 12:49 PM

## 2012-04-11 NOTE — Consult Note (Signed)
[facility] Consultation  Referring Provider: [facility] Primary Care Physician:  No primary provider on file. Primary Gastroenterologist:   none.   Reason for Consultation:  colitis  HPI: Jonathon Lane is a 78 y.o. male admitted from nursing home 04/06/12 with altered mental status, anorexia, leukocytosis and fever. He was dehydrated with electrolyte imbalances. Patient fluid resuscitated, started on antibiotics. Mental status quickly returned to baseline with IV fluid repletion. Antibiotics were discontinued. Then patient developed diarrhea a couple of days ago. C-Diff negative. CTscan reveals left sided and transverse colitis. Patient started on Cipro / Flagyl yesterday. He has low grade temp with normal white count. Patient has dementia, cannot provide history.    Past Medical History  Diagnosis Date  . Hypertension   . Diabetes mellitus without complication   . Dementia   . BPH (benign prostatic hyperplasia)     History reviewed. No pertinent past surgical history.  Prior to Admission medications   Medication Sig Start Date End Date Taking? Authorizing Provider  aspirin EC 81 MG tablet Take 81 mg by mouth daily.   Yes Historical Provider, MD  Chloroxylenol-Zinc Oxide (BAZA EX) Apply 1 application topically as needed. For incontinence care   Yes Historical Provider, MD  cholecalciferol (VITAMIN D) 1000 UNITS tablet Take 1,000 Units by mouth daily.   Yes Historical Provider, MD  docusate sodium (COLACE) 100 MG capsule Take 100 mg by mouth daily.   Yes Historical Provider, MD  finasteride (PROSCAR) 5 MG tablet Take 5 mg by mouth daily.   Yes Historical Provider, MD  furosemide (LASIX) 20 MG tablet Take 20 mg by mouth daily.   Yes Historical Provider, MD  insulin aspart (NOVOLOG) 100 UNIT/ML injection Inject 2-10 Units into the skin 3 (three) times daily before meals. Per sliding scale: CBG 100-200= 2 units 201-250= 4 units 251-300= 6 units 301-350= 8  units 351-400= 10 units   Yes Historical Provider, MD  insulin detemir (LEVEMIR) 100 UNIT/ML injection Inject 35 Units into the skin daily. Hold if CBG < 120.   Yes Historical Provider, MD  meloxicam (MOBIC) 15 MG tablet Take 15 mg by mouth daily.   Yes Historical Provider, MD  metoprolol (LOPRESSOR) 50 MG tablet Take 50 mg by mouth 2 (two) times daily.   Yes Historical Provider, MD  Multiple Vitamin (MULTIVITAMIN WITH MINERALS) TABS Take 1 tablet by mouth daily.   Yes Historical Provider, MD  nystatin (MYCOSTATIN) powder Apply 1 g topically 2 (two) times daily. Apply to Peri area until healed   Yes Historical Provider, MD  pravastatin (PRAVACHOL) 40 MG tablet Take 40 mg by mouth every evening.    Yes Historical Provider, MD  Tamsulosin HCl (FLOMAX) 0.4 MG CAPS Take 0.4 mg by mouth every evening.    Yes Historical Provider, MD  vitamin B-12 (CYANOCOBALAMIN) 1000 MCG tablet Take 1,000 mcg by mouth daily.   Yes Historical Provider, MD  clonazePAM (KLONOPIN) 0.5 MG tablet Take 0.5 mg by mouth 2 (two) times daily.     Historical Provider, MD  PRESCRIPTION MEDICATION Apply 1 mg topically daily as needed. For agitation   Ativan Topical Gel    Historical Provider, MD    Current Facility-Administered Medications  Medication Dose Route Frequency Provider Last Rate Last Dose  . acetaminophen (TYLENOL) tablet 650 mg  650 mg Oral Q6H PRN Clydia Llano, MD   650 mg at 04/08/12 1749   Or  . acetaminophen (TYLENOL) suppository 650 mg  650 mg Rectal Q6H PRN Clydia Llano, MD      .  antiseptic oral rinse (BIOTENE) solution 15 mL  15 mL Mouth Rinse q12n4p Calvert Cantor, MD   15 mL at 04/09/12 1216  . chlorhexidine (PERIDEX) 0.12 % solution 15 mL  15 mL Mouth Rinse BID Calvert Cantor, MD   15 mL at 04/08/12 0847  . ciprofloxacin (CIPRO) IVPB 400 mg  400 mg Intravenous Q12H Leroy Sea, MD      . collagenase (SANTYL) ointment   Topical Daily Calvert Cantor, MD      . insulin aspart (novoLOG) injection 0-15 Units   0-15 Units Subcutaneous TID WC Clydia Llano, MD   3 Units at 04/10/12 1737  . insulin glargine (LANTUS) injection 25 Units  25 Units Subcutaneous QHS Russella Dar, NP   25 Units at 04/10/12 2226  . [COMPLETED] iohexol (OMNIPAQUE) 300 MG/ML solution 100 mL  100 mL Intravenous Once PRN Medication Radiologist, MD   100 mL at 04/10/12 1711  . metroNIDAZOLE (FLAGYL) IVPB 500 mg  500 mg Intravenous Q8H Leroy Sea, MD      . ondansetron (ZOFRAN) tablet 4 mg  4 mg Oral Q6H PRN Clydia Llano, MD       Or  . ondansetron (ZOFRAN) injection 4 mg  4 mg Intravenous Q6H PRN Clydia Llano, MD      . saccharomyces boulardii (FLORASTOR) capsule 250 mg  250 mg Oral BID Richarda Overlie, MD   250 mg at 04/11/12 1004  . sodium chloride 0.45 % 1,000 mL with potassium chloride 10 mEq infusion   Intravenous Continuous Leroy Sea, MD      . sodium chloride 0.9 % injection 3 mL  3 mL Intravenous Q12H Clydia Llano, MD   3 mL at 04/08/12 0953  . [DISCONTINUED] metroNIDAZOLE (FLAGYL) tablet 500 mg  500 mg Oral Q8H Richarda Overlie, MD      . [DISCONTINUED] sodium chloride 0.45 % 1,000 mL with potassium chloride 40 mEq infusion   Intravenous Continuous Richarda Overlie, MD 125 mL/hr at 04/10/12 2357      Allergies as of 04/06/2012  . (No Known Allergies)    History reviewed. No pertinent family history.  History   Social History  . Marital Status: Married    Spouse Name: N/A    Number of Children: N/A  . Years of Education: N/A   Occupational History  . Not on file.   Social History Main Topics  . Smoking status: Former Smoker -- 0.5 packs/day    Quit date: 04/08/1981  . Smokeless tobacco: Not on file     Comment: quit 30 years ago  . Alcohol Use: Not on file  . Drug Use: Not on file  . Sexually Active: Not on file      Review of Systems: Unobtainable secondary to patient's confusion.    PHYSICAL EXAM: Vital signs in last 24 hours: Temp:  [97.6 F (36.4 C)-99.1 F (37.3 C)] 99.1 F (37.3 C)  (11/04 0840) Pulse Rate:  [74-104] 74  (11/04 0840) Resp:  [17-22] 17  (11/04 0610) BP: (105-150)/(58-98) 105/58 mmHg (11/04 0840) SpO2:  [94 %-100 %] 94 % (11/04 0840) Last BM Date: 04/10/12 General:   White  male in NAD Head:  Normocephalic and atraumatic. Eyes:   No icterus.   Conjunctiva pink. Ears:  Normal auditory acuity. Lungs:  Respirations even and unlabored. Lungs clear to auscultation bilaterally.    Heart:  Regular rate and rhythm Abdomen:  Soft, nondistended, no apparent tenderness.  Normal bowel sounds. No appreciable masses or hepatomegaly.  Rectal:  Not performed.  Msk:  Symmetrical without gross deformities.  Extremities:  Keeps both arms folded over abdomen. Resistance met when trying to straighten them. No edema. Neurologic:  Alert not oriented to place / time Skin:  Intact without significant lesions or rashes / decubitus not examined.. Cervical Nodes:  No significant cervical adenopathy. Psych:  Alert and cooperative. Normal affect.  LAB RESULTS:  Basename 04/11/12 0033 04/10/12 1343 04/09/12 0107  WBC 5.5 5.3 5.2  HGB 11.4* 15.0 12.8*  HCT 34.4* 43.7 39.4  PLT 228 227 219   BMET  Basename 04/11/12 0033 04/09/12 0107  NA 141 152*  K 4.2 3.0*  CL 112 117*  CO2 21 23  GLUCOSE 187* 136*  BUN 8 23  CREATININE 0.81 0.83  CALCIUM 7.7* 7.9*   LFT  Basename 04/11/12 0033  PROT 5.5*  ALBUMIN 1.9*  AST 53*  ALT 38  ALKPHOS 52  BILITOT 0.2*  BILIDIR --  IBILI --    STUDIES: Ct Abdomen Pelvis W Contrast  04/10/2012  *RADIOLOGY REPORT*  Clinical Data: Unexplained abdominal pain.  Acute mental status changes, patient nonverbal.  Anorexia.  CT ABDOMEN AND PELVIS WITH CONTRAST  Technique:  Multidetector CT imaging of the abdomen and pelvis was performed following the standard protocol during bolus administration of intravenous contrast.  Contrast: OMNIPAQUE IOHEXOL 300 MG/ML. Oral contrast was not administered.  Comparison: None.  Findings: Images  blurred by patient motion, and beam hardening streak artifact involving the upper abdomen due to the fact that the patient was not able raise the arms.  Wall thickening with mucosal enhancement involving the transverse colon, descending colon and proximal sigmoid colon. Scattered sigmoid colon diverticula.  Cecum positioned in the right upper quadrant.  Appendix not clearly identified, but no pericecal inflammation.  Small bowel normal in appearance.  Stomach normal in appearance.  Small amount of ascites in the perihepatic region and minimally in the paracolic gutters.  Normal appearing liver, spleen, and adrenal glands.  Mild diffuse pancreatic atrophy without focal pancreatic parenchymal abnormality.  Cortical thinning involving both kidneys consistent with age; bilateral extrarenal pelves, but no focal parenchymal abnormality involving either kidney.  Moderate to severe aorto- iliofemoral atherosclerosis without aneurysm.  No significant lymphadenopathy.  Urinary bladder distended, containing a small amount of gas. Moderate prostate gland enlargement; the Foley catheter balloon is inflated within the prostatic urethra.  Normal seminal vesicles. Small amount of presacral edema which may be dependent, as there is dependent edema in the subcutaneous tissues of the flanks and buttocks.  Bone window images demonstrate degenerative changes throughout the lower thoracic and lumbar spine, degenerative changes in both sacroiliac joints, and mild degenerative changes in both hips. Small bilateral pleural effusions and associated passive atelectasis in the lower lobes.  Heart enlarged with left ventricular predominance.  IMPRESSION:  1.  Colitis involving the transverse colon, descending colon, and proximal sigmoid colon. 2.  Small amount of ascites.  3.  Mild pancreatic atrophy. 4.  Mild diffuse cortical thinning involving both kidneys.  No focal renal parenchymal abnormalities. 5.  Foley catheter balloon inflated in the  prostatic urethra.  This should be deflated and the catheter re-advanced.  Foley catheter position was telephoned to the patient's nurse on [address] [place], Randa Evens, at the time of interpretation on 04/10/2012 at 07/25/2018 8 hours.   Original Report Authenticated By: Hulan Saas, M.D.      PREVIOUS ENDOSCOPIES: none that I can find  IMPRESSION / PLAN:  62. 78 year old  white male with c-diff negative diarrhea in setting of left sided / transverse colitis on CTscan. Suspect infectious. Ischemic not ruled out but absence of bleeding makes it less likely. IBD low on list of differential diagnoses. Agree with Cipro and Flagyl. Send stool for lactoferrin. Doubt stool culture would be helpful since already on antibiotics. Will follow along with you.  2. Dementia, not oriented to place or time. Unable to provide history .      Thanks   LOS: 5 days   Willette Cluster  04/11/2012, 11:59 AM

## 2012-04-11 NOTE — Clinical Social Work Placement (Addendum)
    Clinical Social Work Department CLINICAL SOCIAL WORK PLACEMENT NOTE 04/11/2012  Patient:  Jonathon Lane, Jonathon Lane  Account Number:  [id number] Admit date:  04/06/2012  Clinical Social Worker:  Hulan Fray  Date/time:  04/11/2012 11:36 AM  Clinical Social Work is seeking post-discharge placement for this patient at the following level of care:   SKILLED NURSING   (*CSW will update this form in Epic as items are completed)   04/08/2012  Patient/family provided with [facility] Department of Clinical Social Work's list of facilities offering this level of care within the geographic area requested by the patient (or if unable, by the patient's family).  04/08/2012  Patient/family informed of their freedom to choose among providers that offer the needed level of care, that participate in Medicare, Medicaid or managed care program needed by the patient, have an available bed and are willing to accept the patient.  04/08/2012  Patient/family informed of [facility]' ownership interest in [facility], as well as of the fact that they are under no obligation to receive care at this facility.  PASARR submitted to EDS on 02/16/2012 PASARR number received from EDS on 02/16/2012  FL2 transmitted to all facilities in geographic area requested by pt/family on  04/08/2012 FL2 transmitted to all facilities within larger geographic area on 04/11/2012  Patient informed that his/her managed care company has contracts with or will negotiate with  certain facilities, including the following:     Patient/family informed of bed offers received:  04/11/12 Patient chooses bed at [facility] Physician recommends and patient chooses bed at    Patient to be transferred to [facility] on 04-15-12   Patient to be transferred to facility by [facility]  The following physician request were entered in Epic:   Additional Comments:

## 2012-04-11 NOTE — Progress Notes (Signed)
EEG COMPLETED

## 2012-04-11 NOTE — Evaluation (Signed)
Clinical/Bedside Swallow Evaluation Patient Details  Name: Melbert Botelho MRN: [id number] Date of Birth: 1933-12-02  Today's Date: 04/11/2012 Time: 4098-1191 SLP Time Calculation (min): 12 min  Past Medical History:  Past Medical History  Diagnosis Date  . Hypertension   . Diabetes mellitus without complication   . Dementia   . BPH (benign prostatic hyperplasia)    Past Surgical History: History reviewed. No pertinent past surgical history. HPI:  Traycen Goyer is a 78 y.o. male with past medical history of diabetes mellitus, dementia and hypertension. He was brought to the hospital from his nursing home because of altered mental status. His baseline is awake and alert, about a week ago the nursing home staff noticed that he is more lethargic with poor oral intake. He was brought to the emergency department and evaluated on 04/01/2012, according to the notes his lethargy resolved and he was back to his baseline. That was believed to be secondary to Depakote. Since then the patient was not eating or drinking very well, according to his wife in the past 2 days he was getting more lethargic so he was brought back to the emergency department for further evaluation. Upon initial evaluation in the emergency department patient only responds to painful stimuli. Pt had BSE on 04/08/12 and was fully alert, cheerful with dtr at bedside. No overt s/s of aspiration were observed, pt recommended for an dys 3 diet and thin liquids. No f/u recommended. Today Dr. Thedore Mins observed that pt was not alert and felt aspiration risk may have increased. SLP returned to reevaluate pt.    Assessment / Plan / Recommendation Clinical Impression  In comparison with 04/08/12 BSE, pt mentation has changed. Pt today is alert but does not follow most commands, and has limited verbalizations "my back hurts". During prior assessment, pt was cheerful, compliant and talkative. Despite this change, the pts swallow function appears  unchanged. Pt consumed consecutive straw sips with timely swallow and no signs of aspriation. Pt accepted pears from breakfast tray which he masticated and transited well despite missing upper dentition. Pt did have some oral residual with a bite of pancake. Overall the pt is safe to continue a dys 3 diet with thin liquids as long as he is alert and actively accepting PO. No further SLP f/u needed at this time. Will sign off.     Aspiration Risk  Mild    Diet Recommendation Dysphagia 3 (Mechanical Soft);Thin liquid   Liquid Administration via: Straw;Cup Medication Administration: Whole meds with liquid Supervision: Full supervision/cueing for compensatory strategies Compensations: Slow rate;Small sips/bites Postural Changes and/or Swallow Maneuvers: Seated upright 90 degrees    Other  Recommendations Oral Care Recommendations: Oral care BID   Follow Up Recommendations  None    Frequency and Duration        Pertinent Vitals/Pain NA    SLP Swallow Goals     Swallow Study Prior Functional Status       General HPI: Marshun Duva is a 78 y.o. male with past medical history of diabetes mellitus, dementia and hypertension. He was brought to the hospital from his nursing home because of altered mental status. His baseline is awake and alert, about a week ago the nursing home staff noticed that he is more lethargic with poor oral intake. He was brought to the emergency department and evaluated on 04/01/2012, according to the notes his lethargy resolved and he was back to his baseline. That was believed to be secondary to Depakote. Since then the patient was  not eating or drinking very well, according to his wife in the past 2 days he was getting more lethargic so he was brought back to the emergency department for further evaluation. Upon initial evaluation in the emergency department patient only responds to painful stimuli. Pt had BSE on 04/08/12 and was fully alert, cheerful with dtr at  bedside. No overt s/s of aspiration were observed, pt recommended for an dys 3 diet and thin liquids. No f/u recommended. Today Dr. Thedore Mins observed that pt was not alert and felt aspiration risk may have increased. SLP returned to reevaluate pt.  Type of Study: Bedside swallow evaluation Previous Swallow Assessment: BSE 04/08/12 - Dys 3/thin Diet Prior to this Study: Dysphagia 3 (soft);Thin liquids Temperature Spikes Noted: No Respiratory Status: Room air History of Recent Intubation: No Behavior/Cognition: Alert;Cooperative;Pleasant mood;Confused Oral Cavity - Dentition: Missing dentition Self-Feeding Abilities: Needs assist Patient Positioning: Upright in bed Baseline Vocal Quality: Clear Volitional Cough: Cognitively unable to elicit Volitional Swallow: Unable to elicit    Oral/Motor/Sensory Function Overall Oral Motor/Sensory Function: Other (comment) (Pt would not follow oral motor commands. appeared WNL)   Ice Chips     Thin Liquid Thin Liquid: Within functional limits Presentation: Straw    Nectar Thick Nectar Thick Liquid: Not tested   Honey Thick Honey Thick Liquid: Not tested   Puree Puree: Not tested   Solid   GO    Solid: Impaired Presentation: Spoon Oral Phase Impairments: Impaired anterior to posterior transit Oral Phase Functional Implications: Oral residue      [employer], MA CCC-SLP [phone]  Shivonne Schwartzman, [name] 04/11/2012,11:26 AM

## 2012-04-11 NOTE — Procedures (Signed)
History: 78 yo M with Altered mental status  Background: There is a poorly formed, poorly sustained posterior dominant rhythm of 7 Hz. There is a moderately disorganized background of intermixed delta and theta activty.   Photic stimulation: Physiologic driving is present  EEG Diagnosis: 1) Generalized slow activity 2) Slow PDR  Clinical Interpretation: This normal EEG is recorded in the waking and drowsy state.  There is evidence of a mild-moderate nonspecific generalized cerebral dysfunction. There was no seizure or seizure predisposition recorded on this study.   Ritta Slot, MD [facility] [phone]  If 7pm- 7am, please page neurology on call at [phone].

## 2012-04-11 NOTE — Consult Note (Signed)
NEURO HOSPITALIST CONSULT NOTE    Reason for Consult:   AMS HPI:                                                                                                                                          Much of history was obtained from chart as patient is unable to give history, no family members at bedside and I was not successful  contacting them by phone.   Jonathon Lane is an 78 y.o. male with past medical history of dementia. Patient resides in a nursing home and was brought to the hospital after nursing home staff noted his mental status had declined--more lethargic and oral intake had declined. He was initially brought to the hospital on 10/25 for this and then sent back.  Initially Depakote (375 mg BID with level of 23.5) was thought to be the etiology--level at that time was 23.5. No Ammonia level was drawn at that time. Depakote was stopped. The lethargy and poor oral intake continued and patient was brought back to ED. Initially he had VPA level <10, elevated sodium (151,157,152,141), low potassium (3.7,3.1, 3.0, 4.2), slightly elevated creatinine, initial WBC 10.7 which has now returned to baseline. Initial glucose 398 now trended down to 187. B12 1814, Folate in progress, Speech saw him and noted normal oropharyngeal function. GI has been consulted and started him on Cipro and Flagyl IV for colitis.    I have attempted to contact family members but have not been able to reach them on the phone. No family members are in room.    Past Medical History  Diagnosis Date  . Hypertension   . Diabetes mellitus without complication   . Dementia   . BPH (benign prostatic hyperplasia)     Family History: Unattainable due to ams  Social History:  Per chart he quit smoking about 31 years ago. He does not have any smokeless tobacco history on file. His alcohol and drug histories not on file.  No Known Allergies  MEDICATIONS:                                                                                                                      Prior to Admission:  Prescriptions prior to admission  Medication Sig Dispense Refill  .  aspirin EC 81 MG tablet Take 81 mg by mouth daily.      . Chloroxylenol-Zinc Oxide (BAZA EX) Apply 1 application topically as needed. For incontinence care      . cholecalciferol (VITAMIN D) 1000 UNITS tablet Take 1,000 Units by mouth daily.      Marland Kitchen docusate sodium (COLACE) 100 MG capsule Take 100 mg by mouth daily.      . finasteride (PROSCAR) 5 MG tablet Take 5 mg by mouth daily.      . furosemide (LASIX) 20 MG tablet Take 20 mg by mouth daily.      . insulin aspart (NOVOLOG) 100 UNIT/ML injection Inject 2-10 Units into the skin 3 (three) times daily before meals. Per sliding scale: CBG 100-200= 2 units 201-250= 4 units 251-300= 6 units 301-350= 8 units 351-400= 10 units      . insulin detemir (LEVEMIR) 100 UNIT/ML injection Inject 35 Units into the skin daily. Hold if CBG < 120.      . meloxicam (MOBIC) 15 MG tablet Take 15 mg by mouth daily.      . metoprolol (LOPRESSOR) 50 MG tablet Take 50 mg by mouth 2 (two) times daily.      . Multiple Vitamin (MULTIVITAMIN WITH MINERALS) TABS Take 1 tablet by mouth daily.      Marland Kitchen nystatin (MYCOSTATIN) powder Apply 1 g topically 2 (two) times daily. Apply to Peri area until healed      . pravastatin (PRAVACHOL) 40 MG tablet Take 40 mg by mouth every evening.       . Tamsulosin HCl (FLOMAX) 0.4 MG CAPS Take 0.4 mg by mouth every evening.       . vitamin B-12 (CYANOCOBALAMIN) 1000 MCG tablet Take 1,000 mcg by mouth daily.      . clonazePAM (KLONOPIN) 0.5 MG tablet Take 0.5 mg by mouth 2 (two) times daily.       Marland Kitchen PRESCRIPTION MEDICATION Apply 1 mg topically daily as needed. For agitation   Ativan Topical Gel       Scheduled:   . antiseptic oral rinse  15 mL Mouth Rinse q12n4p  . chlorhexidine  15 mL Mouth Rinse BID  . ciprofloxacin  400 mg Intravenous Q12H  . collagenase   Topical Daily  .  insulin aspart  0-15 Units Subcutaneous TID WC  . insulin glargine  25 Units Subcutaneous QHS  . metronidazole  500 mg Intravenous Q8H  . [EXPIRED] potassium chloride  40 mEq Oral BID  . saccharomyces boulardii  250 mg Oral BID  . sodium chloride  3 mL Intravenous Q12H  . [DISCONTINUED] metroNIDAZOLE  500 mg Oral Q8H     ROS:                                                                                                                                       Unattainable due to patients AMS   Blood  pressure 105/58, pulse 74, temperature 99.1 F (37.3 C), temperature source Axillary, resp. rate 17, height 5\' 11"  (1.803 m), weight 79.7 kg (175 lb 11.3 oz), SpO2 94.00%.   Neurologic Examination:                                                                                                      Mental Status: Alert, follows few verbal commands such as closing his eyes and sticking out his tongue. He gives o verbal answers but does yell "ouch" when attempting to obtain palmomental and when moving his legs.  Cranial Nerves: II: Visual fields shows blink to threat bilaterally and tracts my fingers, pupils equal, round, reactive to light  III,IV, VI: extra-ocular motions intact bilaterally V,VII: face symmetric,grimmicaes to pain bilaterally, making a continuous chewing motion with mouth VIII: hearing normal bilaterally--looks to voice bilaterally IX,X: no difficulty swallowing XI: bilateral shoulder shrug--neck is stiff (easier to rotate to the right than left- he does grimmice with neck movement) XII: midline tongue extension Motor: Moves bilateral UE antigravity, withdraws from noxious stimuli well, positive grasp reflex, increased tone throughout UE.  Moves bilateral LE spontaneously but much less then UE.  No antigravity strength noted.  With noxious stimuli he will bend at the knees about 30 degrees.  He has atrophy noted bilaterally in both quadriceps and calves. No tremor or  fasciculations noted.  Sensory: Pinprick and light touch intact throughout, bilaterally Deep Tendon Reflexes: 2+ bilateral UE, 2+ KJ bilaterally and 2+ right AJ, left AJ 1+, bilateral toes downgoing Plantars: Right: downgoing   Left: downgoing Cerebellar: Unable to asses--when moving arms to face I did not note any dysmetria CV: pulses palpable throughout     No results found for this basename: cbc, bmp, coags, chol, tri, ldl, hga1c    Results for orders placed during the hospital encounter of 04/06/12 (from the past 48 hour(s))  GLUCOSE, CAPILLARY     Status: Abnormal   Collection Time   04/09/12 11:54 AM      Component Value Range Comment   Glucose-Capillary 154 (*) 70 - 99 mg/dL    Comment 1 Notify RN     GLUCOSE, CAPILLARY     Status: Abnormal   Collection Time   04/09/12  5:17 PM      Component Value Range Comment   Glucose-Capillary 162 (*) 70 - 99 mg/dL    Comment 1 Notify RN     GLUCOSE, CAPILLARY     Status: Abnormal   Collection Time   04/09/12 10:20 PM      Component Value Range Comment   Glucose-Capillary 157 (*) 70 - 99 mg/dL    Comment 1 Notify RN      Comment 2 Documented in Chart     CLOSTRIDIUM DIFFICILE BY PCR     Status: Normal   Collection Time   04/09/12 11:25 PM      Component Value Range Comment   C difficile by pcr NEGATIVE  NEGATIVE   GLUCOSE, CAPILLARY     Status: Abnormal   Collection Time   04/10/12  9:02 AM      Component Value Range Comment   Glucose-Capillary 128 (*) 70 - 99 mg/dL    Comment 1 Notify RN     OCCULT BLOOD X 1 CARD TO LAB, STOOL     Status: Normal   Collection Time   04/10/12 11:15 AM      Component Value Range Comment   Fecal Occult Bld POSITIVE     GLUCOSE, CAPILLARY     Status: Abnormal   Collection Time   04/10/12 12:27 PM      Component Value Range Comment   Glucose-Capillary 198 (*) 70 - 99 mg/dL    Comment 1 Notify RN     CBC     Status: Normal   Collection Time   04/10/12  1:43 PM      Component Value Range Comment    WBC 5.3  4.0 - 10.5 K/uL    RBC 5.10  4.22 - 5.81 MIL/uL    Hemoglobin 15.0  13.0 - 17.0 g/dL    HCT 45.4  09.8 - 11.9 %    MCV 85.7  78.0 - 100.0 fL    MCH 29.4  26.0 - 34.0 pg    MCHC 34.3  30.0 - 36.0 g/dL    RDW 14.7  82.9 - 56.2 %    Platelets 227  150 - 400 K/uL   GLUCOSE, CAPILLARY     Status: Abnormal   Collection Time   04/10/12  5:25 PM      Component Value Range Comment   Glucose-Capillary 175 (*) 70 - 99 mg/dL    Comment 1 Notify RN     GLUCOSE, CAPILLARY     Status: Abnormal   Collection Time   04/10/12 10:15 PM      Component Value Range Comment   Glucose-Capillary 171 (*) 70 - 99 mg/dL   CBC     Status: Abnormal   Collection Time   04/11/12 12:33 AM      Component Value Range Comment   WBC 5.5  4.0 - 10.5 K/uL    RBC 4.03 (*) 4.22 - 5.81 MIL/uL    Hemoglobin 11.4 (*) 13.0 - 17.0 g/dL DELTA CHECK NOTED   HCT 34.4 (*) 39.0 - 52.0 %    MCV 85.4  78.0 - 100.0 fL    MCH 28.3  26.0 - 34.0 pg    MCHC 33.1  30.0 - 36.0 g/dL    RDW 13.0  86.5 - 78.4 %    Platelets 228  150 - 400 K/uL   COMPREHENSIVE METABOLIC PANEL     Status: Abnormal   Collection Time   04/11/12 12:33 AM      Component Value Range Comment   Sodium 141  135 - 145 mEq/L DELTA CHECK NOTED   Potassium 4.2  3.5 - 5.1 mEq/L DELTA CHECK NOTED   Chloride 112  96 - 112 mEq/L    CO2 21  19 - 32 mEq/L    Glucose, Bld 187 (*) 70 - 99 mg/dL    BUN 8  6 - 23 mg/dL DELTA CHECK NOTED   Creatinine, Ser 0.81  0.50 - 1.35 mg/dL    Calcium 7.7 (*) 8.4 - 10.5 mg/dL    Total Protein 5.5 (*) 6.0 - 8.3 g/dL    Albumin 1.9 (*) 3.5 - 5.2 g/dL    AST 53 (*) 0 - 37 U/L    ALT 38  0 - 53 U/L    Alkaline Phosphatase 52  39 - 117 U/L    Total Bilirubin 0.2 (*) 0.3 - 1.2 mg/dL    GFR calc non Af Amer 84 (*) >90 mL/min    GFR calc Af Amer >90  >90 mL/min   PROCALCITONIN     Status: Normal   Collection Time   04/11/12 12:33 AM      Component Value Range Comment   Procalcitonin <0.10     GLUCOSE, CAPILLARY     Status:  Abnormal   Collection Time   04/11/12  7:50 AM      Component Value Range Comment   Glucose-Capillary 128 (*) 70 - 99 mg/dL    Comment 1 Notify RN       Ct Abdomen Pelvis W Contrast  04/10/2012  *RADIOLOGY REPORT*  Clinical Data: Unexplained abdominal pain.  Acute mental status changes, patient nonverbal.  Anorexia.  CT ABDOMEN AND PELVIS WITH CONTRAST  Technique:  Multidetector CT imaging of the abdomen and pelvis was performed following the standard protocol during bolus administration of intravenous contrast.  Contrast: OMNIPAQUE IOHEXOL 300 MG/ML. Oral contrast was not administered.  Comparison: None.  Findings: Images blurred by patient motion, and beam hardening streak artifact involving the upper abdomen due to the fact that the patient was not able raise the arms.  Wall thickening with mucosal enhancement involving the transverse colon, descending colon and proximal sigmoid colon. Scattered sigmoid colon diverticula.  Cecum positioned in the right upper quadrant.  Appendix not clearly identified, but no pericecal inflammation.  Small bowel normal in appearance.  Stomach normal in appearance.  Small amount of ascites in the perihepatic region and minimally in the paracolic gutters.  Normal appearing liver, spleen, and adrenal glands.  Mild diffuse pancreatic atrophy without focal pancreatic parenchymal abnormality.  Cortical thinning involving both kidneys consistent with age; bilateral extrarenal pelves, but no focal parenchymal abnormality involving either kidney.  Moderate to severe aorto- iliofemoral atherosclerosis without aneurysm.  No significant lymphadenopathy.  Urinary bladder distended, containing a small amount of gas. Moderate prostate gland enlargement; the Foley catheter balloon is inflated within the prostatic urethra.  Normal seminal vesicles. Small amount of presacral edema which may be dependent, as there is dependent edema in the subcutaneous tissues of the flanks and  buttocks.  Bone window images demonstrate degenerative changes throughout the lower thoracic and lumbar spine, degenerative changes in both sacroiliac joints, and mild degenerative changes in both hips. Small bilateral pleural effusions and associated passive atelectasis in the lower lobes.  Heart enlarged with left ventricular predominance.  IMPRESSION:  1.  Colitis involving the transverse colon, descending colon, and proximal sigmoid colon. 2.  Small amount of ascites.  3.  Mild pancreatic atrophy. 4.  Mild diffuse cortical thinning involving both kidneys.  No focal renal parenchymal abnormalities. 5.  Foley catheter balloon inflated in the prostatic urethra.  This should be deflated and the catheter re-advanced.  Foley catheter position was telephoned to the patient's nurse on 6 Gordon, Randa Evens, at the time of interpretation on 04/10/2012 at 07/25/2018 8 hours.   Original Report Authenticated By: Hulan Saas, M.D.     CT HEAD WITHOUT CONTRAST  Technique: Contiguous axial images were obtained from the base of  the skull through the vertex without contrast.  Comparison: Head CT scan 03/05/2012.  Findings: There is some chronic microvascular ischemic change and  cortical atrophy. No evidence of acute abnormality including  infarct, hemorrhage, mass lesion, mass effect, midline shift or  abnormal extra-axial fluid collection. No hydrocephalus or  pneumocephalus. The calvarium is intact. Atherosclerosis is  noted.  IMPRESSION:  No acute finding. Stable compared to prior exam.  Assessment/Plan:  78 YO male who resides at nursing home and has baseline dementia.  Patient was brought to ED due to increased lethargy and decreased mental status. He is on no medications at home for dementia.  Prior to this visit he was on Depakote 375 mg BID but this was D/C'd after recent visit to ED due to thoughts this may be causing his decreased mental status.  Initial Glucose on admission was 260 and 398, negative  CT head for acute infarct, WBC 10.7, normal B12.  Exam shows patient who follows minimal verbal commands, no localizing or lateralizing deficits, chewing motion of orobuccal region but no other focal tremor or seizure activity. He does appear to have increased tone throughout and stiffness in his neck which likely is attributed to his increased tone. Patient is afebrile with no leucocytosis.   Ddx: 1) polymyalgia rheumatica 2) Metabolic encephalopathy (hyperglycemia, hypernatremia, hypokalemia, Thyroid dysfuction) 3) abscence status 4) delirium in a patient with dementia in setting of metabolic abnormalities  Recommend: 1) EEG (Pending) 2) RPR, ammonia, TSH, ESR, CRP (Pending) 3) MRI brain (pending) 4) Consider lumbar puncture with cell count/diff,   Felicie Morn PA-C [facility] [phone]  04/11/2012, 9:50 AM    78 yo M with increased tone bilateral arms and neck, AMS in the setting of dementia. This clinical picture looks to me like polymyalgia rheumatica, but we need to exclude other causes. I have seen and evaluated the patient. I have reviewed the above note and made appropriate changes. Tried contacting family, but were unable to reach them.   Ritta Slot, MD [facility] [phone]  If 7pm- 7am, please page neurology on call at [phone].

## 2012-04-11 NOTE — Progress Notes (Signed)
[facility]                                                                                Patient Demographics  Jonathon Lane, is a 78 y.o. male  ZOX:[id number]  [id number]  DOB - March 31, 1934  Admit date - 04/06/2012  Admitting Physician Clydia Llano, MD  Outpatient Primary MD for the patient is No primary provider on file.  LOS - 5   Chief Complaint  Patient presents with  . Altered Mental Status        Assessment & Plan   Assumed care of the patient on 04/11/2012 on day 5 of his hospital care    Brief narrative:    Jonathon Lane is a 78 y.o. male with past medical history of diabetes mellitus, dementia and hypertension. He was brought to the hospital from his nursing home because of altered mental status. His baseline is awake and alert, and apparently about a month ago he was able to walk according to the wife, about a week ago the nursing home staff noticed that he is more lethargic with poor oral intake. He was brought to the emergency department and evaluated on 04/01/2012, according to the notes his lethargy resolved and he was back to his baseline. That was believed to be secondary to Depakote. Since then the patient was not eating or drinking very well, according to his wife in the past 2 days he was getting more lethargic so he was brought back to the emergency department for further evaluation.    Initially in the ER she only responded to painful stimuli, thereafter in step down unit patient with supportive care with IV fluids and empiric antibiotics showed good improvement, step down physician Dr. Sharon Seller canceled his lumbar puncture as clinical picture was not consistent with meningitis, he was then transferred to the floor where he was seen by my partner Dr. a brawl, will obtain CT abdomen pelvis consistent with colitis on 04/10/2012, I have taken over the patient on 04/11/2012, at this point I have consulted GI and started him on Cipro and  Flagyl IV for colitis, he is C. difficile negative. I've also discussed his case with neurologist on call Dr. Petra Kuba for his opinion in regards to patient's mental status change, he's not close to his 13 month old baseline of walking and feeding himself. In my opinion this could be natural course of declined of his baseline medical problem of advanced dementia, however will like neurology to rule out any reversible causes of mental status decline.    Assessment/Plan:     Dehydration with hypernatremia   Source after IV fluids last BMP stable.     Diarrhea/ abdominal pain   C. difficile PCR negative, CT abdomen pelvis on 04/10/2012 consistent with colitis, placed on IV Cipro Flagyl on 04/11/2012 and requested GI to admit the patient.    Altered mental state   Could be natural course of declined of his advanced dementia, earlier clinically ruled out meningitis and LP was canceled by Dr. Sharon Seller, his dehydration and hyponatremia better, however according to the wife patient was walking a month ago, will request neurology to evaluate the patient to rule out  any reversible causes of mental status declined, mental status itself is better from what it was on admission but still not close to his 48 month old baseline.    Sinus Bradycardia   Was on beta blocker, beta blockers on hold heart rate stable baseline EKG shows sinus rhythm with rate of 56 beats per minute. Stable blood pressure.    Fever/ Leukocytosis  *Fever has resolved  *Leukocytosis has resolved  *No neck stiffness, HA, or photophobia and denies headache * As above meningitis is felt to be very unlikely at this time so will dc droplet precautions  *Influenza panel PCR negative  * Urine culture negative- see below regarding blood cx's- dc antibiotics  *Procalcitonin level is normal at 0.10  * observe clinically off abx - if sx recur. Question if it was due to colitis which is now evident on CT scan of abdomen  pelvis     ? Bacteremia due to Gram-positive bacteria  *One out of two bottles has grown coag negative staph which is likely a contaminant - will observe off abx for now      Hypertension  Currently off of his blood pressure medications and beta blockers, heart rate on the lower side blood pressure currently stable will continue to monitor     Diabetes mellitus type 2, uncontrolled  Continue Lantus 25 units at hour of sleep and continue moderate sliding scale insulin and 15 units meal coverage  *Appreciate diabetes educator assistance  *Hemoglobin A1c is 7.3   CBG (last 3)   Basename 04/11/12 0750 04/10/12 2215 04/10/12 1725  GLUCAP 128* 171* 175*      Dementia  *Before admission to skilled nursing facility patient apparently was ambulatory  Was not on medications prior to admission      ARF (acute renal failure) next  Resolved , creatinine 1.5 to upon admission down to 0.83  *Seems related to dehydration with BUN and creatinine decreasing after hydration initiated      Acute respiratory failure with hypoxia  *Resolved , was likely due to decreased mental status *Continue supportive care with oxygen and pulmonary toileting  Request PT reevaluate for aspiration risk evaluation on 04/11/2012    Decubitus ulcer of sacral region, unstageable  *Appreciate wound ostomy nurse evaluation  *Air mattress overlay has been ordered and recommendations are to begin Santyl ointment to chemically debride the area which is covered by 100% eschar. The area measures 5 x 3 cm diameter       Antibiotics:  Rocephin 10/30 >>> 04/08/12  Vancomycin 10/30 >>> 04/08/12  Levaquin 04/08/12 >>> 04/08/12  Zosyn 04/08/12 >>> 04/08/12  Cipro and Flagyl IV started on 04/11/2012     Code Status: DNR/DNI  Family Communication: Discussed with the wife on phone in detail on 04/11/2012  Disposition Plan: SNF    Procedures CT of the head, CT abdomen pelvis   Consults  GI, neurology,  wound care   Time Spent in minutes   35   Antibiotics     Anti-infectives     Start     Dose/Rate Route Frequency Ordered Stop   04/11/12 1000   ciprofloxacin (CIPRO) IVPB 400 mg        400 mg 200 mL/hr over 60 Minutes Intravenous Every 12 hours 04/11/12 0907     04/11/12 1000   metroNIDAZOLE (FLAGYL) IVPB 500 mg        500 mg 100 mL/hr over 60 Minutes Intravenous Every 8 hours 04/11/12 0907     04/10/12  1400   metroNIDAZOLE (FLAGYL) tablet 500 mg  Status:  Discontinued        500 mg Oral 3 times per day 04/10/12 0947 04/10/12 1331   04/08/12 1000   piperacillin-tazobactam (ZOSYN) IVPB 3.375 g  Status:  Discontinued        3.375 g 12.5 mL/hr over 240 Minutes Intravenous Every 8 hours 04/08/12 0835 04/08/12 1220   04/08/12 1000   levofloxacin (LEVAQUIN) IVPB 750 mg  Status:  Discontinued        750 mg 100 mL/hr over 90 Minutes Intravenous Every 24 hours 04/08/12 0836 04/08/12 1220   04/07/12 1700   vancomycin (VANCOCIN) IVPB 1000 mg/200 mL premix  Status:  Discontinued        1,000 mg 200 mL/hr over 60 Minutes Intravenous Every 24 hours 04/06/12 2037 04/07/12 0812   04/07/12 0900   vancomycin (VANCOCIN) IVPB 1000 mg/200 mL premix  Status:  Discontinued        1,000 mg 200 mL/hr over 60 Minutes Intravenous Every 12 hours 04/07/12 0812 04/08/12 1220   04/07/12 0500   cefTRIAXone (ROCEPHIN) 2 g in dextrose 5 % 50 mL IVPB  Status:  Discontinued        2 g 100 mL/hr over 30 Minutes Intravenous Every 12 hours 04/06/12 2037 04/08/12 0835   04/06/12 1700   cefTRIAXone (ROCEPHIN) 2 g in dextrose 5 % 50 mL IVPB        2 g 100 mL/hr over 30 Minutes Intravenous  Once 04/06/12 1639 04/06/12 1808   04/06/12 1700   vancomycin (VANCOCIN) IVPB 1000 mg/200 mL premix        1,000 mg 200 mL/hr over 60 Minutes Intravenous  Once 04/06/12 1648 04/06/12 1908          Scheduled Meds:   . antiseptic oral rinse  15 mL Mouth Rinse q12n4p  . chlorhexidine  15 mL Mouth Rinse BID  .  ciprofloxacin  400 mg Intravenous Q12H  . collagenase   Topical Daily  . insulin aspart  0-15 Units Subcutaneous TID WC  . insulin glargine  25 Units Subcutaneous QHS  . metronidazole  500 mg Intravenous Q8H  . [EXPIRED] potassium chloride  40 mEq Oral BID  . saccharomyces boulardii  250 mg Oral BID  . sodium chloride  3 mL Intravenous Q12H  . [DISCONTINUED] metroNIDAZOLE  500 mg Oral Q8H   Continuous Infusions:   . sodium chloride 0.45 % with kcl 125 mL/hr at 04/10/12 2357   PRN Meds:.acetaminophen, acetaminophen, [COMPLETED] iohexol, ondansetron (ZOFRAN) IV, ondansetron   DVT Prophylaxis    SCDs   Lab Results  Component Value Date   PLT 228 04/11/2012      Susa Raring K M.D on 04/11/2012 at 9:22 AM  Between 7am to 7pm - Pager - [phone]  After 7pm go to [email or web address] - password [email or web address]  And look for the night coverage person covering for me after hours  [facility] Office  [phone]    Subjective:   Jonathon Lane today remains to be a unreliable historian however he looks comfortable, not serious 2 abdominal pain, denies headache   Objective:   Filed Vitals:   04/10/12 1400 04/10/12 2130 04/11/12 0610 04/11/12 0840  BP: 150/98 110/68 130/81 105/58  Pulse: 78 104 100 74  Temp: 97.6 F (36.4 C) 98.1 F (36.7 C) 98.2 F (36.8 C) 99.1 F (37.3 C)  TempSrc: Axillary Axillary Axillary Axillary  Resp: 22 18 17    Height:  Weight:      SpO2: 98% 97% 100% 94%    Wt Readings from Last 3 Encounters:  04/07/12 79.7 kg (175 lb 11.3 oz)     Intake/Output Summary (Last 24 hours) at 04/11/12 9562 Last data filed at 04/11/12 0500  Gross per 24 hour  Intake   3335 ml  Output   3100 ml  Net    235 ml    Exam Awake , responds a few basic commands and answer a few questions, No new F.N deficits, Normal affect [place].AT,PERRAL Supple Neck,No JVD, No cervical lymphadenopathy appriciated.  Symmetrical Chest wall movement, Good air movement  bilaterally, CTAB RRR,No Gallops,Rubs or new Murmurs, No Parasternal Heave +ve B.Sounds, Abd Soft, mild generalized tenderness, No organomegaly appriciated, No rebound - guarding or rigidity. Foley catheter in place No Cyanosis, Clubbing or edema, No new Rash or bruise.   Data Review   Micro Results Recent Results (from the past 240 hour(s))  URINE CULTURE     Status: Normal   Collection Time   04/06/12  9:55 AM      Component Value Range Status Comment   Specimen Description URINE, CATHETERIZED   Final    Special Requests NONE   Final    Culture  Setup Time 04/06/2012 15:40   Final    Colony Count NO GROWTH   Final    Culture NO GROWTH   Final    Report Status 04/07/2012 FINAL   Final   CULTURE, BLOOD (ROUTINE X 2)     Status: Normal (Preliminary result)   Collection Time   04/06/12 10:00 AM      Component Value Range Status Comment   Specimen Description BLOOD RIGHT ARM   Final    Special Requests BOTTLES DRAWN AEROBIC AND ANAEROBIC 10CC   Final    Culture  Setup Time 04/06/2012 15:22   Final    Culture     Final    Value:        BLOOD CULTURE RECEIVED NO GROWTH TO DATE CULTURE WILL BE HELD FOR 5 DAYS BEFORE ISSUING A FINAL NEGATIVE REPORT   Report Status PENDING   Incomplete   CULTURE, BLOOD (ROUTINE X 2)     Status: Normal   Collection Time   04/06/12 10:12 AM      Component Value Range Status Comment   Specimen Description BLOOD RIGHT ARM   Final    Special Requests BOTTLES DRAWN AEROBIC AND ANAEROBIC 10CC   Final    Culture  Setup Time 04/06/2012 15:23   Final    Culture     Final    Value: STAPHYLOCOCCUS SPECIES (COAGULASE NEGATIVE)     Note: THE SIGNIFICANCE OF ISOLATING THIS ORGANISM FROM A SINGLE SET OF BLOOD CULTURES WHEN MULTIPLE SETS ARE DRAWN IS UNCERTAIN. PLEASE NOTIFY THE MICROBIOLOGY DEPARTMENT WITHIN ONE WEEK IF SPECIATION AND SENSITIVITIES ARE REQUIRED.     Note: Gram Stain Report Called to,Read Back By and Verified With: TATA CARBONE @0910  04/07/12 BY KRAWS    Report Status 04/08/2012 FINAL   Final   MRSA PCR SCREENING     Status: Normal   Collection Time   04/06/12  9:03 PM      Component Value Range Status Comment   MRSA by PCR NEGATIVE  NEGATIVE Final   CLOSTRIDIUM DIFFICILE BY PCR     Status: Normal   Collection Time   04/09/12 11:25 PM      Component Value Range Status Comment   C difficile  by pcr NEGATIVE  NEGATIVE Final     Radiology Reports Ct Head Wo Contrast  04/07/2012  *RADIOLOGY REPORT*  Clinical Data: Altered mental status and confusion  CT HEAD WITHOUT CONTRAST  Technique:  Contiguous axial images were obtained from the base of the skull through the vertex without contrast.  Comparison: CT 04/01/2012  Findings: Moderately severe atrophy.  Diffuse ventricular enlargement is unchanged and most compatible with central atrophy.  No acute infarct.  Negative for hemorrhage or mass lesion.  No fluid collection.  Extensive carotid atherosclerotic calcification. Bilateral vertebral calcification.  No skull lesion.  IMPRESSION: Ventricular enlargement, compatible with atrophy.  No acute abnormality and no interval change.   Original Report Authenticated By: Janeece Riggers, M.D.    Ct Head Wo Contrast  04/01/2012  *RADIOLOGY REPORT*  Clinical Data: Pain.  CT HEAD WITHOUT CONTRAST  Technique:  Contiguous axial images were obtained from the base of the skull through the vertex without contrast.  Comparison: Head CT scan 03/05/2012.  Findings: There is some chronic microvascular ischemic change and cortical atrophy.  No evidence of acute abnormality including infarct, hemorrhage, mass lesion, mass effect, midline shift or abnormal extra-axial fluid collection.  No hydrocephalus or pneumocephalus.  The calvarium is intact.  Atherosclerosis is noted.  IMPRESSION: No acute finding.  Stable compared to prior exam.   Original Report Authenticated By: Bernadene Bell. Maricela Curet, M.D.    Ct Abdomen Pelvis W Contrast  04/10/2012  *RADIOLOGY REPORT*  Clinical Data:  Unexplained abdominal pain.  Acute mental status changes, patient nonverbal.  Anorexia.  CT ABDOMEN AND PELVIS WITH CONTRAST  Technique:  Multidetector CT imaging of the abdomen and pelvis was performed following the standard protocol during bolus administration of intravenous contrast.  Contrast: OMNIPAQUE IOHEXOL 300 MG/ML. Oral contrast was not administered.  Comparison: None.  Findings: Images blurred by patient motion, and beam hardening streak artifact involving the upper abdomen due to the fact that the patient was not able raise the arms.  Wall thickening with mucosal enhancement involving the transverse colon, descending colon and proximal sigmoid colon. Scattered sigmoid colon diverticula.  Cecum positioned in the right upper quadrant.  Appendix not clearly identified, but no pericecal inflammation.  Small bowel normal in appearance.  Stomach normal in appearance.  Small amount of ascites in the perihepatic region and minimally in the paracolic gutters.  Normal appearing liver, spleen, and adrenal glands.  Mild diffuse pancreatic atrophy without focal pancreatic parenchymal abnormality.  Cortical thinning involving both kidneys consistent with age; bilateral extrarenal pelves, but no focal parenchymal abnormality involving either kidney.  Moderate to severe aorto- iliofemoral atherosclerosis without aneurysm.  No significant lymphadenopathy.  Urinary bladder distended, containing a small amount of gas. Moderate prostate gland enlargement; the Foley catheter balloon is inflated within the prostatic urethra.  Normal seminal vesicles. Small amount of presacral edema which may be dependent, as there is dependent edema in the subcutaneous tissues of the flanks and buttocks.  Bone window images demonstrate degenerative changes throughout the lower thoracic and lumbar spine, degenerative changes in both sacroiliac joints, and mild degenerative changes in both hips. Small bilateral pleural effusions and  associated passive atelectasis in the lower lobes.  Heart enlarged with left ventricular predominance.  IMPRESSION:  1.  Colitis involving the transverse colon, descending colon, and proximal sigmoid colon. 2.  Small amount of ascites.  3.  Mild pancreatic atrophy. 4.  Mild diffuse cortical thinning involving both kidneys.  No focal renal parenchymal abnormalities. 5.  Foley catheter  balloon inflated in the prostatic urethra.  This should be deflated and the catheter re-advanced.  Foley catheter position was telephoned to the patient's nurse on [address] [place], Randa Evens, at the time of interpretation on 04/10/2012 at 07/25/2018 8 hours.   Original Report Authenticated By: Hulan Saas, M.D.    Dg Chest Port 1 View  04/07/2012  *RADIOLOGY REPORT*  Clinical Data: Weakness, pneumonia  PORTABLE CHEST - 1 VIEW  Comparison: Chest radiograph 04/06/2012  Findings: Sternotomy wires overlie normal cardiac silhouette.  No effusion, infiltrate, or pneumothorax.  Mild basilar atelectasis on the left. No acute osseous abnormality.  IMPRESSION: Mild left basilar atelectasis.  No evidence of pneumonia.   Original Report Authenticated By: Genevive Bi, M.D.    Dg Chest Port 1 View  04/06/2012  *RADIOLOGY REPORT*  Clinical Data: Fever  PORTABLE CHEST - 1 VIEW  Comparison: None.  Findings: Cardiomediastinal silhouette is unremarkable.  Status post CABG.  No acute infiltrate or pleural effusion.  No pulmonary edema.  Degenerative changes right AC joint.  IMPRESSION: .  No active disease.  Status post CABG.   Original Report Authenticated By: Natasha Mead, M.D.     CBC  Lab 04/11/12 0033 04/10/12 1343 04/09/12 0107 04/08/12 0438 04/07/12 0450 04/06/12 0948  WBC 5.5 5.3 5.2 6.7 10.7* --  HGB 11.4* 15.0 12.8* 12.2* 14.3 --  HCT 34.4* 43.7 39.4 37.6* 44.4 --  PLT 228 227 219 209 282 --  MCV 85.4 85.7 88.9 90.4 91.0 --  MCH 28.3 29.4 28.9 29.3 29.3 --  MCHC 33.1 34.3 32.5 32.4 32.2 --  RDW 13.3 13.2 13.6 14.1 14.3 --   LYMPHSABS -- -- -- -- -- 2.0  MONOABS -- -- -- -- -- 1.1*  EOSABS -- -- -- -- -- 0.1  BASOSABS -- -- -- -- -- 0.0  BANDABS -- -- -- -- -- --    Chemistries   Lab 04/11/12 0033 04/09/12 0107 04/08/12 0438 04/07/12 2102 04/07/12 1205 04/06/12 0948  NA 141 152* 157* 154* 152* --  K 4.2 3.0* 3.1* 3.4* 3.4* --  CL 112 117* 125* 120* 118* --  CO2 21 23 23 24 23  --  GLUCOSE 187* 136* 212* 166* 361* --  BUN 8 23 28* 30* 35* --  CREATININE 0.81 0.83 0.88 0.93 1.11 --  CALCIUM 7.7* 7.9* 7.9* 8.1* 7.8* --  MG -- -- -- -- -- --  AST 53* -- 28 -- -- 27  ALT 38 -- 23 -- -- 26  ALKPHOS 52 -- 66 -- -- 60  BILITOT 0.2* -- 0.2* -- -- 0.4   ------------------------------------------------------------------------------------------------------------------ estimated creatinine clearance is 82.6 ml/min (by C-G formula based on Cr of 0.81). ------------------------------------------------------------------------------------------------------------------ No results found for this basename: HGBA1C:2 in the last 72 hours ------------------------------------------------------------------------------------------------------------------ No results found for this basename: CHOL:2,HDL:2,LDLCALC:2,TRIG:2,CHOLHDL:2,LDLDIRECT:2 in the last 72 hours ------------------------------------------------------------------------------------------------------------------ No results found for this basename: TSH,T4TOTAL,FREET3,T3FREE,THYROIDAB in the last 72 hours ------------------------------------------------------------------------------------------------------------------  [facility] 04/09/12 0107  VITAMINB12 1814*  FOLATE --  FERRITIN --  TIBC --  IRON --  RETICCTPCT --    Coagulation profile  Lab 04/06/12 1712  INR 1.48  PROTIME --    No results found for this basename: DDIMER:2 in the last 72 hours  Cardiac Enzymes No results found for this basename: CK:3,CKMB:3,TROPONINI:3,MYOGLOBIN:3 in the last 168  hours ------------------------------------------------------------------------------------------------------------------ No components found with this basename: POCBNP:3

## 2012-04-11 NOTE — Clinical Social Work Note (Signed)
Clinical Social Worker spoke with patient's daughter, Waynetta Sandy and wife, Alexia Freestone [phone]) regarding expanding SNF search into [place] area. Per [facility], she reported that she lives in [place] and patient's wife lives in [place] and [place]. Daughter reported that she would like CSW to expand search to [place] area as there were no bed offers currently in the [place]. CSW spoke with patient's wife and she was agreeable to expand SNF search. CSW will continue to follow and update family when bed offers are made.   Rozetta Nunnery MSW, [employer] [phone]

## 2012-04-11 NOTE — Consult Note (Signed)
Pt seen and examined.  Full note to follow.  Pt has and unspecified colitis.  Suspect infectious >>ischemic colitis.  Doubt primary IBD.  Recommend continuing IV antibiotics.  Check stool lactoferrin;  Await stool C&S.

## 2012-04-12 ENCOUNTER — Inpatient Hospital Stay (HOSPITAL_COMMUNITY): Payer: Medicare Other

## 2012-04-12 LAB — CBC
HCT: 35.7 % — ABNORMAL LOW (ref 39.0–52.0)
MCHC: 33.9 g/dL (ref 30.0–36.0)
MCV: 84.4 fL (ref 78.0–100.0)
Platelets: 234 10*3/uL (ref 150–400)
RDW: 13.5 % (ref 11.5–15.5)
WBC: 6 10*3/uL (ref 4.0–10.5)

## 2012-04-12 LAB — BASIC METABOLIC PANEL
BUN: 7 mg/dL (ref 6–23)
Creatinine, Ser: 0.84 mg/dL (ref 0.50–1.35)
GFR calc Af Amer: >90 mL/min (ref >90)
GFR calc non Af Amer: 83 mL/min — ABNORMAL LOW (ref >90)
Potassium: 4 mEq/L (ref 3.5–5.1)

## 2012-04-12 LAB — CULTURE, BLOOD (ROUTINE X 2)

## 2012-04-12 LAB — PROTEIN AND GLUCOSE, CSF
Glucose, CSF: 71 mg/dL (ref 43–76)
Total  Protein, CSF: 104 mg/dL — ABNORMAL HIGH (ref 15–45)

## 2012-04-12 LAB — CSF CELL COUNT WITH DIFFERENTIAL: Tube #: 3

## 2012-04-12 LAB — MAGNESIUM: Magnesium: 2.2 mg/dL (ref 1.5–2.5)

## 2012-04-12 LAB — GLUCOSE, CAPILLARY
Glucose-Capillary: 115 mg/dL — ABNORMAL HIGH (ref 70–99)
Glucose-Capillary: 134 mg/dL — ABNORMAL HIGH (ref 70–99)

## 2012-04-12 MED ORDER — LORAZEPAM 2 MG/ML IJ SOLN
1.0000 mg | Freq: Once | INTRAMUSCULAR | Status: AC
  Administered 2012-04-12: 1 mg via INTRAVENOUS

## 2012-04-12 MED ORDER — DEXTROSE 5 % IV SOLN
800.0000 mg | Freq: Three times a day (TID) | INTRAVENOUS | Status: DC
  Administered 2012-04-12 – 2012-04-14 (×5): 800 mg via INTRAVENOUS
  Filled 2012-04-12 (×7): qty 16

## 2012-04-12 NOTE — Progress Notes (Addendum)
ANTIBIOTIC CONSULT NOTE - FOLLOW UP  Pharmacy Consult for Acyclovir Indication: meningitis coverage  No Known Allergies  Patient Measurements: Height: 5\' 11"  (180.3 cm) Weight: 175 lb 11.3 oz (79.7 kg) IBW/kg (Calculated) : 75.3   Vital Signs: Temp: 97.6 F (36.4 C) (11/05 0640) BP: 120/81 mmHg (11/05 0640) Pulse Rate: 80  (11/05 0640) Intake/Output from previous day: 11/04 0701 - 11/05 0700 In: 2339.3 [P.O.:480; I.V.:1159.3; IV Piggyback:700] Out: 3101 [Urine:3100; Stool:1] Intake/Output from this shift: Total I/O In: 814 [P.O.:100; I.V.:714] Out: 652 [Urine:650; Stool:2]  Labs:  [facility] 04/12/12 0700 04/11/12 0033 04/10/12 1343  WBC 6.0 5.5 5.3  HGB 12.1* 11.4* 15.0  PLT 234 228 227  LABCREA -- -- --  CREATININE 0.84 0.81 --   Estimated Creatinine Clearance: 79.7 ml/min (by C-G formula based on Cr of 0.84). No results found for this basename: VANCOTROUGH:2,VANCOPEAK:2,VANCORANDOM:2,GENTTROUGH:2,GENTPEAK:2,GENTRANDOM:2,TOBRATROUGH:2,TOBRAPEAK:2,TOBRARND:2,AMIKACINPEAK:2,AMIKACINTROU:2,AMIKACIN:2, in the last 72 hours   Microbiology: Recent Results (from the past 720 hour(s))  URINE CULTURE     Status: Normal   Collection Time   04/06/12  9:55 AM      Component Value Range Status Comment   Specimen Description URINE, CATHETERIZED   Final    Special Requests NONE   Final    Culture  Setup Time 04/06/2012 15:40   Final    Colony Count NO GROWTH   Final    Culture NO GROWTH   Final    Report Status 04/07/2012 FINAL   Final   CULTURE, BLOOD (ROUTINE X 2)     Status: Normal   Collection Time   04/06/12 10:00 AM      Component Value Range Status Comment   Specimen Description BLOOD RIGHT ARM   Final    Special Requests BOTTLES DRAWN AEROBIC AND ANAEROBIC 10CC   Final    Culture  Setup Time 04/06/2012 15:22   Final    Culture NO GROWTH 5 DAYS   Final    Report Status 04/12/2012 FINAL   Final   CULTURE, BLOOD (ROUTINE X 2)     Status: Normal   Collection Time   04/06/12 10:12 AM      Component Value Range Status Comment   Specimen Description BLOOD RIGHT ARM   Final    Special Requests BOTTLES DRAWN AEROBIC AND ANAEROBIC 10CC   Final    Culture  Setup Time 04/06/2012 15:23   Final    Culture     Final    Value: STAPHYLOCOCCUS SPECIES (COAGULASE NEGATIVE)     Note: THE SIGNIFICANCE OF ISOLATING THIS ORGANISM FROM A SINGLE SET OF BLOOD CULTURES WHEN MULTIPLE SETS ARE DRAWN IS UNCERTAIN. PLEASE NOTIFY THE MICROBIOLOGY DEPARTMENT WITHIN ONE WEEK IF SPECIATION AND SENSITIVITIES ARE REQUIRED.     Note: Gram Stain Report Called to,Read Back By and Verified With: TATA CARBONE @0910  04/07/12 BY KRAWS   Report Status 04/08/2012 FINAL   Final   MRSA PCR SCREENING     Status: Normal   Collection Time   04/06/12  9:03 PM      Component Value Range Status Comment   MRSA by PCR NEGATIVE  NEGATIVE Final   CLOSTRIDIUM DIFFICILE BY PCR     Status: Normal   Collection Time   04/09/12 11:25 PM      Component Value Range Status Comment   C difficile by pcr NEGATIVE  NEGATIVE Final   GRAM STAIN     Status: Normal   Collection Time   04/12/12  1:50 PM  Component Value Range Status Comment   Specimen Description CSF   Final    Special Requests 2.1ML   Final    Gram Stain     Final    Value: CYTOSPIN PREP     NO WBC SEEN     NO ORGANISMS SEEN   Report Status 04/12/2012 FINAL   Final     Anti-infectives     Start     Dose/Rate Route Frequency Ordered Stop   04/12/12 1900   acyclovir (ZOVIRAX) 800 mg in dextrose 5 % 150 mL IVPB        800 mg 166 mL/hr over 60 Minutes Intravenous Every 8 hours 04/12/12 1822     04/11/12 1000   ciprofloxacin (CIPRO) IVPB 400 mg        400 mg 200 mL/hr over 60 Minutes Intravenous Every 12 hours 04/11/12 0907     04/11/12 1000   metroNIDAZOLE (FLAGYL) IVPB 500 mg        500 mg 100 mL/hr over 60 Minutes Intravenous Every 8 hours 04/11/12 0907     04/10/12 1400   metroNIDAZOLE (FLAGYL) tablet 500 mg  Status:  Discontinued         500 mg Oral 3 times per day 04/10/12 0947 04/10/12 1331   04/08/12 1000   piperacillin-tazobactam (ZOSYN) IVPB 3.375 g  Status:  Discontinued        3.375 g 12.5 mL/hr over 240 Minutes Intravenous Every 8 hours 04/08/12 0835 04/08/12 1220   04/08/12 1000   levofloxacin (LEVAQUIN) IVPB 750 mg  Status:  Discontinued        750 mg 100 mL/hr over 90 Minutes Intravenous Every 24 hours 04/08/12 0836 04/08/12 1220   04/07/12 1700   vancomycin (VANCOCIN) IVPB 1000 mg/200 mL premix  Status:  Discontinued        1,000 mg 200 mL/hr over 60 Minutes Intravenous Every 24 hours 04/06/12 2037 04/07/12 0812   04/07/12 0900   vancomycin (VANCOCIN) IVPB 1000 mg/200 mL premix  Status:  Discontinued        1,000 mg 200 mL/hr over 60 Minutes Intravenous Every 12 hours 04/07/12 0812 04/08/12 1220   04/07/12 0500   cefTRIAXone (ROCEPHIN) 2 g in dextrose 5 % 50 mL IVPB  Status:  Discontinued        2 g 100 mL/hr over 30 Minutes Intravenous Every 12 hours 04/06/12 2037 04/08/12 0835   04/06/12 1700   cefTRIAXone (ROCEPHIN) 2 g in dextrose 5 % 50 mL IVPB        2 g 100 mL/hr over 30 Minutes Intravenous  Once 04/06/12 1639 04/06/12 1808   04/06/12 1700   vancomycin (VANCOCIN) IVPB 1000 mg/200 mL premix        1,000 mg 200 mL/hr over 60 Minutes Intravenous  Once 04/06/12 1648 04/06/12 1908          Assessment:  s/p LP. To begin Acyclovir for meningitis coverage.  Currently on day #2 Cipro 400 mg IV q12hr and Metronidazole 500 mg IV q8hrs for colitis.   Ceftriaxone 10/30>>11/1   Vancomycin 10/30>>11/1   Levofloxacin 10/31>>10/31 (received 1 dose)    Goal of Therapy:  appropriate Acyclovir dose for renal function and infection  Plan:    Will begin Acyclovir 800 mg  IV q8hrs. (10 mg/kg/dose).   Will follow up renal function, culture data and clinical status, and antibiotic/antiviral plans.  Dennie Fetters, [place] Pager: [phone] 04/12/2012,6:23 PM

## 2012-04-12 NOTE — Procedures (Signed)
Informed consent was obtained by telephone from the patient's wife.  The risks, benefits and alternatives of the procedure were discussed.  She voiced understanding of the discussion and gave verbal consent to proceed (witnessed). Subsequently, the patient was brought to the fluoroscopy suite and placed prone upon the fluoroscopy table.  A time-out procedure was performed.  Fluoroscopy was utilized to identify the L3-L4 interspace and a small mark was placed on the patient's skin.  The patient was then prepped and draped in the usual sterile fashion.  A small amount of 1% lidocaine was infiltrated subcutaneously to provide local anesthesia.  Subsequently, under intermittent fluoroscopic guidance a 20-gauge spinal needle was introduced percutaneously into the L3-L4 interspace and clear CSF was obtained.  This CSF was stored in sequential vials until a total of 12 ml of clear cerebrospinal fluid was obtained.  The needle was removed from the patient and a small bandage was placed upon entry wound. Estimated blood loss less than 1 ml.  No complications.  The specimens were sent to the laboratory for testing analysis per orders of the primary team.

## 2012-04-12 NOTE — Progress Notes (Signed)
[facility]                                                                                Patient Demographics  Jonathon Lane, is a 78 y.o. male  VHQ:[id number]  UXL:[id number]  DOB - 07/04/1933  Admit date - 04/06/2012  Admitting Physician Clydia Llano, MD  Outpatient Primary MD for the patient is No primary provider on file.  LOS - 6   Chief Complaint  Patient presents with  . Altered Mental Status        Assessment & Plan   Assumed care of the patient on 04/11/2012 on day 5 of his hospital care    Brief narrative:    Jonathon Lane is a 78 y.o. male with past medical history of diabetes mellitus, dementia and hypertension. He was brought to the hospital from his nursing home because of altered mental status. His baseline is awake and alert, and apparently about a month ago he was able to walk according to the wife, about a week ago the nursing home staff noticed that he is more lethargic with poor oral intake. He was brought to the emergency department and evaluated on 04/01/2012, according to the notes his lethargy resolved and he was back to his baseline. That was believed to be secondary to Depakote. Since then the patient was not eating or drinking very well, according to his wife in the past 2 days he was getting more lethargic so he was brought back to the emergency department for further evaluation.    Initially in the ER she only responded to painful stimuli, thereafter in step down unit patient with supportive care with IV fluids and empiric antibiotics showed good improvement, step down physician Dr. Sharon Seller canceled his lumbar puncture as clinical picture was not consistent with meningitis, he was then transferred to the floor where he was seen by my partner Dr. a brawl, will obtain CT abdomen pelvis consistent with colitis on 04/10/2012, I have taken over the patient on 04/11/2012, at this point I have consulted GI and started him on Cipro and  Flagyl IV for colitis, he is C. difficile negative.    I've also discussed his case with neurologist on call Dr. Petra Kuba for his opinion in regards to patient's mental status change, he's not close to his 55 month old baseline of walking and feeding himself. In my opinion this could be natural course of declined of his baseline medical problem of advanced dementia, however have called neurology to rule out any reversible causes of mental status decline. MRI stable, LP ordered eventually on 04-12-12 after D/W Neuro.    Assessment/Plan:    Altered mental state   Could be natural course of declined of his advanced dementia, earlier clinically ruled out meningitis and LP was canceled by Dr. Sharon Seller, his dehydration and hyponatremia better, however according to the wife patient was walking a month ago, will request neurology to evaluate the patient to rule out any reversible causes of mental status declined, mental status itself is better from what it was on admission but still not close to his 71 month old baseline. Neuro called, MRI stable, LP ordered.  Diarrhea/ abdominal pain   C. difficile PCR negative, CT abdomen pelvis on 04/10/2012 consistent with colitis, placed on IV Cipro Flagyl on 04/11/2012 and requested GI to following the patient.     Dehydration with hypernatremia   Source after IV fluids last BMP stable.    Sinus Bradycardia   Was on beta blocker, beta blockers on hold heart rate stable baseline EKG shows sinus rhythm with rate of 56 beats per minute. Stable blood pressure.    Fever/ Leukocytosis  *Fever & Leukocytosis have resolved  *Influenza panel PCR negative  * Urine culture negative   *Procalcitonin level is normal at 0.10   Question if it was due to colitis which is now evident on CT scan of abdomen pelvis     ? Bacteremia due to Gram-positive bacteria  *One out of two bottles has grown coag negative staph which is likely a contaminant - will  observe off abx for now      Hypertension  Currently off of his blood pressure medications and beta blockers, heart rate on the lower side blood pressure currently stable will continue to monitor     Diabetes mellitus type 2, uncontrolled  Continue Lantus 25 units at hour of sleep and continue moderate sliding scale insulin and 15 units meal coverage  *Appreciate diabetes educator assistance  *Hemoglobin A1c is 7.3   CBG (last 3)   Basename 04/12/12 0951 04/11/12 2132 04/11/12 1723  GLUCAP 146* 115* 116*      Dementia  *Before admission to skilled nursing facility patient apparently was ambulatory  Was not on medications prior to admission      ARF (acute renal failure) next  Resolved , creatinine 1.5 to upon admission down to 0.83  *Seems related to dehydration with BUN and creatinine decreasing after hydration initiated      Acute respiratory failure with hypoxia  *Resolved , was likely due to decreased mental status *Continue supportive care with oxygen and pulmonary toileting  Request PT reevaluate for aspiration risk evaluation on 04/11/2012    Decubitus ulcer of sacral region, unstageable  *Appreciate wound ostomy nurse evaluation  *Air mattress overlay has been ordered and recommendations are to begin Santyl ointment to chemically debride the area which is covered by 100% eschar. The area measures 5 x 3 cm diameter     Low Calcium - Alb low too, will check ion Ca.     Antibiotics:  Rocephin 10/30 >>> 04/08/12  Vancomycin 10/30 >>> 04/08/12  Levaquin 04/08/12 >>> 04/08/12  Zosyn 04/08/12 >>> 04/08/12  Cipro and Flagyl IV started on 04/11/2012     Code Status: DNR/DNI  Family Communication: Discussed with the wife on phone in detail on 04/11/2012  Disposition Plan: SNF    Procedures CT of the head, CT abdomen pelvis   Consults  GI, neurology, wound care   Time Spent in minutes   35   Antibiotics     Anti-infectives     Start      Dose/Rate Route Frequency Ordered Stop   04/11/12 1000   ciprofloxacin (CIPRO) IVPB 400 mg        400 mg 200 mL/hr over 60 Minutes Intravenous Every 12 hours 04/11/12 0907     04/11/12 1000   metroNIDAZOLE (FLAGYL) IVPB 500 mg        500 mg 100 mL/hr over 60 Minutes Intravenous Every 8 hours 04/11/12 0907     04/10/12 1400   metroNIDAZOLE (FLAGYL) tablet 500 mg  Status:  Discontinued  500 mg Oral 3 times per day 04/10/12 0947 04/10/12 1331   04/08/12 1000   piperacillin-tazobactam (ZOSYN) IVPB 3.375 g  Status:  Discontinued        3.375 g 12.5 mL/hr over 240 Minutes Intravenous Every 8 hours 04/08/12 0835 04/08/12 1220   04/08/12 1000   levofloxacin (LEVAQUIN) IVPB 750 mg  Status:  Discontinued        750 mg 100 mL/hr over 90 Minutes Intravenous Every 24 hours 04/08/12 0836 04/08/12 1220   04/07/12 1700   vancomycin (VANCOCIN) IVPB 1000 mg/200 mL premix  Status:  Discontinued        1,000 mg 200 mL/hr over 60 Minutes Intravenous Every 24 hours 04/06/12 2037 04/07/12 0812   04/07/12 0900   vancomycin (VANCOCIN) IVPB 1000 mg/200 mL premix  Status:  Discontinued        1,000 mg 200 mL/hr over 60 Minutes Intravenous Every 12 hours 04/07/12 0812 04/08/12 1220   04/07/12 0500   cefTRIAXone (ROCEPHIN) 2 g in dextrose 5 % 50 mL IVPB  Status:  Discontinued        2 g 100 mL/hr over 30 Minutes Intravenous Every 12 hours 04/06/12 2037 04/08/12 0835   04/06/12 1700   cefTRIAXone (ROCEPHIN) 2 g in dextrose 5 % 50 mL IVPB        2 g 100 mL/hr over 30 Minutes Intravenous  Once 04/06/12 1639 04/06/12 1808   04/06/12 1700   vancomycin (VANCOCIN) IVPB 1000 mg/200 mL premix        1,000 mg 200 mL/hr over 60 Minutes Intravenous  Once 04/06/12 1648 04/06/12 1908          Scheduled Meds:    . antiseptic oral rinse  15 mL Mouth Rinse q12n4p  . chlorhexidine  15 mL Mouth Rinse BID  . ciprofloxacin  400 mg Intravenous Q12H  . collagenase   Topical Daily  . insulin aspart  0-15 Units  Subcutaneous TID WC  . insulin glargine  25 Units Subcutaneous QHS  . [COMPLETED] LORazepam  1 mg Intravenous Once  . metronidazole  500 mg Intravenous Q8H  . saccharomyces boulardii  250 mg Oral BID  . sodium chloride  3 mL Intravenous Q12H   Continuous Infusions:    . sodium chloride 0.45 % with kcl 75 mL/hr at 04/11/12 1600   PRN Meds:.acetaminophen, acetaminophen, ondansetron (ZOFRAN) IV, ondansetron   DVT Prophylaxis    SCDs   Lab Results  Component Value Date   PLT 234 04/12/2012      Susa Raring K M.D on 04/12/2012 at 10:34 AM  Between 7am to 7pm - Pager - [phone]  After 7pm go to [email or web address] - password [email or web address]  And look for the night coverage person covering for me after hours  [facility] Office  [phone]    Subjective:   Jonathon Lane today remains to be a unreliable historian however he looks comfortable, not serious 2 abdominal pain, denies headache   Objective:   Filed Vitals:   04/11/12 0840 04/11/12 1500 04/11/12 2148 04/12/12 0640  BP: 105/58 110/66 131/81 120/81  Pulse: 74 78 83 80  Temp: 99.1 F (37.3 C) 99.2 F (37.3 C) 97.9 F (36.6 C) 97.6 F (36.4 C)  TempSrc: Axillary Axillary Axillary   Resp:  18 18 18   Height:      Weight:      SpO2: 94% 97% 96% 95%    Wt Readings from Last 3 Encounters:  04/07/12 79.7 kg (175 lb 11.3  oz)     Intake/Output Summary (Last 24 hours) at 04/12/12 1034 Last data filed at 04/12/12 0640  Gross per 24 hour  Intake 2099.25 ml  Output   3101 ml  Net -1001.75 ml    Exam Awake , responds a few basic commands and answer a few questions, No new F.N deficits, Normal affect [place].AT,PERRAL Supple Neck,No JVD, No cervical lymphadenopathy appriciated.  Symmetrical Chest wall movement, Good air movement bilaterally, CTAB RRR,No Gallops,Rubs or new Murmurs, No Parasternal Heave +ve B.Sounds, Abd Soft, mild generalized tenderness, No organomegaly appriciated, No rebound - guarding or  rigidity. Foley catheter in place No Cyanosis, Clubbing or edema, No new Rash or bruise.   Data Review   Micro Results Recent Results (from the past 240 hour(s))  URINE CULTURE     Status: Normal   Collection Time   04/06/12  9:55 AM      Component Value Range Status Comment   Specimen Description URINE, CATHETERIZED   Final    Special Requests NONE   Final    Culture  Setup Time 04/06/2012 15:40   Final    Colony Count NO GROWTH   Final    Culture NO GROWTH   Final    Report Status 04/07/2012 FINAL   Final   CULTURE, BLOOD (ROUTINE X 2)     Status: Normal   Collection Time   04/06/12 10:00 AM      Component Value Range Status Comment   Specimen Description BLOOD RIGHT ARM   Final    Special Requests BOTTLES DRAWN AEROBIC AND ANAEROBIC 10CC   Final    Culture  Setup Time 04/06/2012 15:22   Final    Culture NO GROWTH 5 DAYS   Final    Report Status 04/12/2012 FINAL   Final   CULTURE, BLOOD (ROUTINE X 2)     Status: Normal   Collection Time   04/06/12 10:12 AM      Component Value Range Status Comment   Specimen Description BLOOD RIGHT ARM   Final    Special Requests BOTTLES DRAWN AEROBIC AND ANAEROBIC 10CC   Final    Culture  Setup Time 04/06/2012 15:23   Final    Culture     Final    Value: STAPHYLOCOCCUS SPECIES (COAGULASE NEGATIVE)     Note: THE SIGNIFICANCE OF ISOLATING THIS ORGANISM FROM A SINGLE SET OF BLOOD CULTURES WHEN MULTIPLE SETS ARE DRAWN IS UNCERTAIN. PLEASE NOTIFY THE MICROBIOLOGY DEPARTMENT WITHIN ONE WEEK IF SPECIATION AND SENSITIVITIES ARE REQUIRED.     Note: Gram Stain Report Called to,Read Back By and Verified With: TATA CARBONE @0910  04/07/12 BY KRAWS   Report Status 04/08/2012 FINAL   Final   MRSA PCR SCREENING     Status: Normal   Collection Time   04/06/12  9:03 PM      Component Value Range Status Comment   MRSA by PCR NEGATIVE  NEGATIVE Final   CLOSTRIDIUM DIFFICILE BY PCR     Status: Normal   Collection Time   04/09/12 11:25 PM      Component  Value Range Status Comment   C difficile by pcr NEGATIVE  NEGATIVE Final     Radiology Reports Ct Head Wo Contrast  04/07/2012  *RADIOLOGY REPORT*  Clinical Data: Altered mental status and confusion  CT HEAD WITHOUT CONTRAST  Technique:  Contiguous axial images were obtained from the base of the skull through the vertex without contrast.  Comparison: CT 04/01/2012  Findings: Moderately severe  atrophy.  Diffuse ventricular enlargement is unchanged and most compatible with central atrophy.  No acute infarct.  Negative for hemorrhage or mass lesion.  No fluid collection.  Extensive carotid atherosclerotic calcification. Bilateral vertebral calcification.  No skull lesion.  IMPRESSION: Ventricular enlargement, compatible with atrophy.  No acute abnormality and no interval change.   Original Report Authenticated By: Janeece Riggers, M.D.    Ct Head Wo Contrast  04/01/2012  *RADIOLOGY REPORT*  Clinical Data: Pain.  CT HEAD WITHOUT CONTRAST  Technique:  Contiguous axial images were obtained from the base of the skull through the vertex without contrast.  Comparison: Head CT scan 03/05/2012.  Findings: There is some chronic microvascular ischemic change and cortical atrophy.  No evidence of acute abnormality including infarct, hemorrhage, mass lesion, mass effect, midline shift or abnormal extra-axial fluid collection.  No hydrocephalus or pneumocephalus.  The calvarium is intact.  Atherosclerosis is noted.  IMPRESSION: No acute finding.  Stable compared to prior exam.   Original Report Authenticated By: Bernadene Bell. Maricela Curet, M.D.    Ct Abdomen Pelvis W Contrast  04/10/2012  *RADIOLOGY REPORT*  Clinical Data: Unexplained abdominal pain.  Acute mental status changes, patient nonverbal.  Anorexia.  CT ABDOMEN AND PELVIS WITH CONTRAST  Technique:  Multidetector CT imaging of the abdomen and pelvis was performed following the standard protocol during bolus administration of intravenous contrast.  Contrast:  OMNIPAQUE IOHEXOL 300 MG/ML. Oral contrast was not administered.  Comparison: None.  Findings: Images blurred by patient motion, and beam hardening streak artifact involving the upper abdomen due to the fact that the patient was not able raise the arms.  Wall thickening with mucosal enhancement involving the transverse colon, descending colon and proximal sigmoid colon. Scattered sigmoid colon diverticula.  Cecum positioned in the right upper quadrant.  Appendix not clearly identified, but no pericecal inflammation.  Small bowel normal in appearance.  Stomach normal in appearance.  Small amount of ascites in the perihepatic region and minimally in the paracolic gutters.  Normal appearing liver, spleen, and adrenal glands.  Mild diffuse pancreatic atrophy without focal pancreatic parenchymal abnormality.  Cortical thinning involving both kidneys consistent with age; bilateral extrarenal pelves, but no focal parenchymal abnormality involving either kidney.  Moderate to severe aorto- iliofemoral atherosclerosis without aneurysm.  No significant lymphadenopathy.  Urinary bladder distended, containing a small amount of gas. Moderate prostate gland enlargement; the Foley catheter balloon is inflated within the prostatic urethra.  Normal seminal vesicles. Small amount of presacral edema which may be dependent, as there is dependent edema in the subcutaneous tissues of the flanks and buttocks.  Bone window images demonstrate degenerative changes throughout the lower thoracic and lumbar spine, degenerative changes in both sacroiliac joints, and mild degenerative changes in both hips. Small bilateral pleural effusions and associated passive atelectasis in the lower lobes.  Heart enlarged with left ventricular predominance.  IMPRESSION:  1.  Colitis involving the transverse colon, descending colon, and proximal sigmoid colon. 2.  Small amount of ascites.  3.  Mild pancreatic atrophy. 4.  Mild diffuse cortical thinning  involving both kidneys.  No focal renal parenchymal abnormalities. 5.  Foley catheter balloon inflated in the prostatic urethra.  This should be deflated and the catheter re-advanced.  Foley catheter position was telephoned to the patient's nurse on 6 [place], Randa Evens, at the time of interpretation on 04/10/2012 at 07/25/2018 8 hours.   Original Report Authenticated By: Hulan Saas, M.D.    Dg Chest Port 1 View  04/07/2012  *RADIOLOGY  REPORT*  Clinical Data: Weakness, pneumonia  PORTABLE CHEST - 1 VIEW  Comparison: Chest radiograph 04/06/2012  Findings: Sternotomy wires overlie normal cardiac silhouette.  No effusion, infiltrate, or pneumothorax.  Mild basilar atelectasis on the left. No acute osseous abnormality.  IMPRESSION: Mild left basilar atelectasis.  No evidence of pneumonia.   Original Report Authenticated By: Genevive Bi, M.D.    Dg Chest Port 1 View  04/06/2012  *RADIOLOGY REPORT*  Clinical Data: Fever  PORTABLE CHEST - 1 VIEW  Comparison: None.  Findings: Cardiomediastinal silhouette is unremarkable.  Status post CABG.  No acute infiltrate or pleural effusion.  No pulmonary edema.  Degenerative changes right AC joint.  IMPRESSION: .  No active disease.  Status post CABG.   Original Report Authenticated By: Natasha Mead, M.D.     CBC  Lab 04/12/12 0700 04/11/12 0033 04/10/12 1343 04/09/12 0107 04/08/12 0438 04/06/12 0948  WBC 6.0 5.5 5.3 5.2 6.7 --  HGB 12.1* 11.4* 15.0 12.8* 12.2* --  HCT 35.7* 34.4* 43.7 39.4 37.6* --  PLT 234 228 227 219 209 --  MCV 84.4 85.4 85.7 88.9 90.4 --  MCH 28.6 28.3 29.4 28.9 29.3 --  MCHC 33.9 33.1 34.3 32.5 32.4 --  RDW 13.5 13.3 13.2 13.6 14.1 --  LYMPHSABS -- -- -- -- -- 2.0  MONOABS -- -- -- -- -- 1.1*  EOSABS -- -- -- -- -- 0.1  BASOSABS -- -- -- -- -- 0.0  BANDABS -- -- -- -- -- --    Chemistries   Lab 04/12/12 0700 04/11/12 0033 04/09/12 0107 04/08/12 0438 04/07/12 2102 04/06/12 0948  NA 135 141 152* 157* 154* --  K 4.0 4.2 3.0* 3.1*  3.4* --  CL 103 112 117* 125* 120* --  CO2 23 21 23 23 24  --  GLUCOSE 136* 187* 136* 212* 166* --  BUN 7 8 23  28* 30* --  CREATININE 0.84 0.81 0.83 0.88 0.93 --  CALCIUM 7.9* 7.7* 7.9* 7.9* 8.1* --  MG 2.2 -- -- -- -- --  AST -- 53* -- 28 -- 27  ALT -- 38 -- 23 -- 26  ALKPHOS -- 52 -- 66 -- 60  BILITOT -- 0.2* -- 0.2* -- 0.4   ------------------------------------------------------------------------------------------------------------------ estimated creatinine clearance is 79.7 ml/min (by C-G formula based on Cr of 0.84). ------------------------------------------------------------------------------------------------------------------ No results found for this basename: HGBA1C:2 in the last 72 hours ------------------------------------------------------------------------------------------------------------------ No results found for this basename: CHOL:2,HDL:2,LDLCALC:2,TRIG:2,CHOLHDL:2,LDLDIRECT:2 in the last 72 hours ------------------------------------------------------------------------------------------------------------------  [facility] 04/11/12 1133  TSH 2.014  T4TOTAL --  T3FREE --  THYROIDAB --   ------------------------------------------------------------------------------------------------------------------ No results found for this basename: VITAMINB12:2,FOLATE:2,FERRITIN:2,TIBC:2,IRON:2,RETICCTPCT:2 in the last 72 hours  Coagulation profile  Lab 04/06/12 1712  INR 1.48  PROTIME --    No results found for this basename: DDIMER:2 in the last 72 hours  Cardiac Enzymes No results found for this basename: CK:3,CKMB:3,TROPONINI:3,MYOGLOBIN:3 in the last 168 hours ------------------------------------------------------------------------------------------------------------------ No components found with this basename: POCBNP:3

## 2012-04-12 NOTE — Progress Notes (Signed)
I have personally taken an interval history, reviewed the chart, and examined the patient.  I agree with the extender's note, impression and recommendations.  Zayda Angell D. Cheikh Bramble, MD, FACG [facility] [phone]  

## 2012-04-12 NOTE — Progress Notes (Signed)
NEURO HOSPITALIST PROGRESS NOTE   SUBJECTIVE:                                                                                                                        Patient remains nonverbal other than verbalizing "ouch" when arms are moved.    OBJECTIVE:                                                                                                                           Vital signs in last 24 hours: Temp:  [97.6 F (36.4 C)-99.2 F (37.3 C)] 97.6 F (36.4 C) (11/05 0640) Pulse Rate:  [78-83] 80  (11/05 0640) Resp:  [18] 18  (11/05 0640) BP: (110-131)/(66-81) 120/81 mmHg (11/05 0640) SpO2:  [95 %-97 %] 95 % (11/05 0640)  Intake/Output from previous day: 11/04 0701 - 11/05 0700 In: 2339.3 [P.O.:480; I.V.:1159.3; IV Piggyback:700] Out: 3101 [Urine:3100; Stool:1] Intake/Output this shift:   Nutritional status: Dysphagia  Past Medical History  Diagnosis Date  . Hypertension   . Diabetes mellitus without complication   . Dementia   . BPH (benign prostatic hyperplasia)      Neurologic Exam:  Mental Status: Patient is awake he is non-verbal except for when his arms are moved in which he will yell "ouch".  Cranial Nerves: II: blinks to threat bilaterally, pupils equal, round, reactive to light and accommodation III,IV, VI: ptosis not present, extra-ocular motions intact bilaterally V,VII: Face symmetric, facial light touch sensation normal bilaterally VIII: opens eyes to voice IX,X: cough present XI: bilateral shoulder shrug XII: midline tongue extension Motor: Withdraws all extremities from pain and localizes to pain.   Sensory: withdraws from pain bilaterally Deep Tendon Reflexes: 2+ and symmetric throughout Plantars: 2+ bilateral UE, 2+ KJ bilaterally and 2+ right AJ, left AJ 1+, bilateral toes downgoing CV: pulses palpable throughout     Lab Results: Results for orders placed during the hospital encounter of 04/06/12 (from  the past 48 hour(s))  OCCULT BLOOD X 1 CARD TO LAB, STOOL     Status: Normal   Collection Time   04/10/12 11:15 AM      Component Value Range Comment   Fecal Occult Bld POSITIVE     GLUCOSE, CAPILLARY  Status: Abnormal   Collection Time   04/10/12 12:27 PM      Component Value Range Comment   Glucose-Capillary 198 (*) 70 - 99 mg/dL    Comment 1 Notify RN     CBC     Status: Normal   Collection Time   04/10/12  1:43 PM      Component Value Range Comment   WBC 5.3  4.0 - 10.5 K/uL    RBC 5.10  4.22 - 5.81 MIL/uL    Hemoglobin 15.0  13.0 - 17.0 g/dL    HCT 16.1  09.6 - 04.5 %    MCV 85.7  78.0 - 100.0 fL    MCH 29.4  26.0 - 34.0 pg    MCHC 34.3  30.0 - 36.0 g/dL    RDW 40.9  81.1 - 91.4 %    Platelets 227  150 - 400 K/uL   GLUCOSE, CAPILLARY     Status: Abnormal   Collection Time   04/10/12  5:25 PM      Component Value Range Comment   Glucose-Capillary 175 (*) 70 - 99 mg/dL    Comment 1 Notify RN     GLUCOSE, CAPILLARY     Status: Abnormal   Collection Time   04/10/12 10:15 PM      Component Value Range Comment   Glucose-Capillary 171 (*) 70 - 99 mg/dL   CBC     Status: Abnormal   Collection Time   04/11/12 12:33 AM      Component Value Range Comment   WBC 5.5  4.0 - 10.5 K/uL    RBC 4.03 (*) 4.22 - 5.81 MIL/uL    Hemoglobin 11.4 (*) 13.0 - 17.0 g/dL DELTA CHECK NOTED   HCT 34.4 (*) 39.0 - 52.0 %    MCV 85.4  78.0 - 100.0 fL    MCH 28.3  26.0 - 34.0 pg    MCHC 33.1  30.0 - 36.0 g/dL    RDW 78.2  95.6 - 21.3 %    Platelets 228  150 - 400 K/uL   COMPREHENSIVE METABOLIC PANEL     Status: Abnormal   Collection Time   04/11/12 12:33 AM      Component Value Range Comment   Sodium 141  135 - 145 mEq/L DELTA CHECK NOTED   Potassium 4.2  3.5 - 5.1 mEq/L DELTA CHECK NOTED   Chloride 112  96 - 112 mEq/L    CO2 21  19 - 32 mEq/L    Glucose, Bld 187 (*) 70 - 99 mg/dL    BUN 8  6 - 23 mg/dL DELTA CHECK NOTED   Creatinine, Ser 0.81  0.50 - 1.35 mg/dL    Calcium 7.7 (*) 8.4 -  10.5 mg/dL    Total Protein 5.5 (*) 6.0 - 8.3 g/dL    Albumin 1.9 (*) 3.5 - 5.2 g/dL    AST 53 (*) 0 - 37 U/L    ALT 38  0 - 53 U/L    Alkaline Phosphatase 52  39 - 117 U/L    Total Bilirubin 0.2 (*) 0.3 - 1.2 mg/dL    GFR calc non Af Amer 84 (*) >90 mL/min    GFR calc Af Amer >90  >90 mL/min   PROCALCITONIN     Status: Normal   Collection Time   04/11/12 12:33 AM      Component Value Range Comment   Procalcitonin <0.10     GLUCOSE, CAPILLARY  Status: Abnormal   Collection Time   04/11/12  7:50 AM      Component Value Range Comment   Glucose-Capillary 128 (*) 70 - 99 mg/dL    Comment 1 Notify RN     AMMONIA     Status: Normal   Collection Time   04/11/12 11:33 AM      Component Value Range Comment   Ammonia 13  11 - 60 umol/L   TSH     Status: Normal   Collection Time   04/11/12 11:33 AM      Component Value Range Comment   TSH 2.014  0.350 - 4.500 uIU/mL   RPR     Status: Normal   Collection Time   04/11/12 11:33 AM      Component Value Range Comment   RPR NON REACTIVE  NON REACTIVE   SEDIMENTATION RATE     Status: Abnormal   Collection Time   04/11/12 11:33 AM      Component Value Range Comment   Sed Rate 54 (*) 0 - 16 mm/hr   C-REACTIVE PROTEIN     Status: Abnormal   Collection Time   04/11/12 11:33 AM      Component Value Range Comment   CRP 3.4 (*) <0.60 mg/dL   GLUCOSE, CAPILLARY     Status: Abnormal   Collection Time   04/11/12 11:57 AM      Component Value Range Comment   Glucose-Capillary 153 (*) 70 - 99 mg/dL   GLUCOSE, CAPILLARY     Status: Abnormal   Collection Time   04/11/12  5:23 PM      Component Value Range Comment   Glucose-Capillary 116 (*) 70 - 99 mg/dL    Comment 1 Notify RN     GLUCOSE, CAPILLARY     Status: Abnormal   Collection Time   04/11/12  9:32 PM      Component Value Range Comment   Glucose-Capillary 115 (*) 70 - 99 mg/dL   MAGNESIUM     Status: Normal   Collection Time   04/12/12  7:00 AM      Component Value Range Comment    Magnesium 2.2  1.5 - 2.5 mg/dL   CBC     Status: Abnormal   Collection Time   04/12/12  7:00 AM      Component Value Range Comment   WBC 6.0  4.0 - 10.5 K/uL    RBC 4.23  4.22 - 5.81 MIL/uL    Hemoglobin 12.1 (*) 13.0 - 17.0 g/dL    HCT 16.1 (*) 09.6 - 52.0 %    MCV 84.4  78.0 - 100.0 fL    MCH 28.6  26.0 - 34.0 pg    MCHC 33.9  30.0 - 36.0 g/dL    RDW 04.5  40.9 - 81.1 %    Platelets 234  150 - 400 K/uL   BASIC METABOLIC PANEL     Status: Abnormal   Collection Time   04/12/12  7:00 AM      Component Value Range Comment   Sodium 135  135 - 145 mEq/L    Potassium 4.0  3.5 - 5.1 mEq/L    Chloride 103  96 - 112 mEq/L    CO2 23  19 - 32 mEq/L    Glucose, Bld 136 (*) 70 - 99 mg/dL    BUN 7  6 - 23 mg/dL    Creatinine, Ser 9.14  0.50 - 1.35 mg/dL  Calcium 7.9 (*) 8.4 - 10.5 mg/dL    GFR calc non Af Amer 83 (*) >90 mL/min    GFR calc Af Amer >90  >90 mL/min   GLUCOSE, CAPILLARY     Status: Abnormal   Collection Time   04/12/12  9:51 AM      Component Value Range Comment   Glucose-Capillary 146 (*) 70 - 99 mg/dL    Lipid Panel No results found for this basename: CHOL,TRIG,HDL,CHOLHDL,VLDL,LDLCALC in the last 72 hours  Studies/Results: Mr Brain Wo Contrast  04/12/2012  *RADIOLOGY REPORT*  Clinical Data: Altered mental status.  Possible CVA. History of hypertension and diabetes.  History of dementia.  History of acute renal failure.  MRI HEAD WITHOUT CONTRAST  Technique:  Multiplanar, multiecho pulse sequences of the brain and surrounding structures were obtained according to standard protocol without intravenous contrast.  Comparison: Multiple prior CT scans, most recent 04/07/2012.  Findings: There is no evidence for acute infarction, intracranial hemorrhage, mass lesion, hydrocephalus, or extra-axial fluid. Moderate ventricular enlargement is accompanied by prominence of the cisterns and sulci along with prominent bilateral sylvian fissures consistent with hydrocephalus ex vacuo.   Increased signal in the periventricular and subcortical white matter most likely represents chronic microvascular ischemic change.  Numerous supratentorial and infratentorial punctate foci of T2 shortening on T2* GRE sequence can be identified, representing remote microbleeds.  This is most commonly associated with hypertensive cerebrovascular disease.  Unremarkable pituitary, cerebellar tonsils, and upper cervical region.  Mild pannus.  No worrisome osseous findings.  Negative sinuses and mastoids.  Major intracranial vascular structures patent.  Subcentimeter cyst left parotid.  Compared with prior CTs, the appearance is similar.  IMPRESSION: Atrophy with chronic small vessel disease as described.   Multiple tiny chronic  microbleeds, sequelae of hypertensive cerebrovascular disease.  No acute stroke.  No cerebellar or posterior fossa mass lesion.   Original Report Authenticated By: Davonna Belling, M.D.    Ct Abdomen Pelvis W Contrast  04/10/2012  *RADIOLOGY REPORT*  Clinical Data: Unexplained abdominal pain.  Acute mental status changes, patient nonverbal.  Anorexia.  CT ABDOMEN AND PELVIS WITH CONTRAST  Technique:  Multidetector CT imaging of the abdomen and pelvis was performed following the standard protocol during bolus administration of intravenous contrast.  Contrast: OMNIPAQUE IOHEXOL 300 MG/ML. Oral contrast was not administered.  Comparison: None.  Findings: Images blurred by patient motion, and beam hardening streak artifact involving the upper abdomen due to the fact that the patient was not able raise the arms.  Wall thickening with mucosal enhancement involving the transverse colon, descending colon and proximal sigmoid colon. Scattered sigmoid colon diverticula.  Cecum positioned in the right upper quadrant.  Appendix not clearly identified, but no pericecal inflammation.  Small bowel normal in appearance.  Stomach normal in appearance.  Small amount of ascites in the perihepatic region and  minimally in the paracolic gutters.  Normal appearing liver, spleen, and adrenal glands.  Mild diffuse pancreatic atrophy without focal pancreatic parenchymal abnormality.  Cortical thinning involving both kidneys consistent with age; bilateral extrarenal pelves, but no focal parenchymal abnormality involving either kidney.  Moderate to severe aorto- iliofemoral atherosclerosis without aneurysm.  No significant lymphadenopathy.  Urinary bladder distended, containing a small amount of gas. Moderate prostate gland enlargement; the Foley catheter balloon is inflated within the prostatic urethra.  Normal seminal vesicles. Small amount of presacral edema which may be dependent, as there is dependent edema in the subcutaneous tissues of the flanks and buttocks.  Bone window  images demonstrate degenerative changes throughout the lower thoracic and lumbar spine, degenerative changes in both sacroiliac joints, and mild degenerative changes in both hips. Small bilateral pleural effusions and associated passive atelectasis in the lower lobes.  Heart enlarged with left ventricular predominance.  IMPRESSION:  1.  Colitis involving the transverse colon, descending colon, and proximal sigmoid colon. 2.  Small amount of ascites.  3.  Mild pancreatic atrophy. 4.  Mild diffuse cortical thinning involving both kidneys.  No focal renal parenchymal abnormalities. 5.  Foley catheter balloon inflated in the prostatic urethra.  This should be deflated and the catheter re-advanced.  Foley catheter position was telephoned to the patient's nurse on 6 [place], Randa Evens, at the time of interpretation on 04/10/2012 at 07/25/2018 8 hours.   Original Report Authenticated By: Hulan Saas, M.D.     MEDICATIONS                                                                                                                        Scheduled:    . antiseptic oral rinse  15 mL Mouth Rinse q12n4p  . chlorhexidine  15 mL Mouth Rinse BID  .  ciprofloxacin  400 mg Intravenous Q12H  . collagenase   Topical Daily  . insulin aspart  0-15 Units Subcutaneous TID WC  . insulin glargine  25 Units Subcutaneous QHS  . [COMPLETED] LORazepam  1 mg Intravenous Once  . metronidazole  500 mg Intravenous Q8H  . saccharomyces boulardii  250 mg Oral BID  . sodium chloride  3 mL Intravenous Q12H   EEG: Clinical Interpretation: This normal EEG is recorded in the waking and drowsy state. There is evidence of a mild-moderate nonspecific generalized cerebral dysfunction. There was no seizure or seizure predisposition recorded on this study.    ASSESSMENT/PLAN:                                                                                                               Patient Active Hospital Problem List:  Altered mental state (04/06/2012)   Assessment: AMS in setting of Dementia.  At this time elevated ESR and CRP may be due to colitis.  Will R/O other causes .  If other causes are ruled out would then consider repeating test at a further date.   MRI negative for acute stroke, TSH, RPR, Ammonia have all returned normal.  Will obtain LP to further evaluate CSF for possible etiologies  Plan: 1) LP planned for later this afternoon.   Felicie Morn PA-C [facility] [phone]  04/12/2012, 11:07 AM    I have seen and evaluated the patient. I have reviewed the above note and made appropriate changes.  LP shows increased CSF protein, concern for viral process, would continue HSV by PCR pending results. I still wonder if PMR is a possibility as some autoimmune processes can give an elevated CSF protein, but I am not familiar with typical results in PMR and have not been able to find a description of this.    Ritta Slot, MD [facility] [phone]  If 7pm- 7am, please page neurology on call at [phone].

## 2012-04-12 NOTE — Evaluation (Signed)
Physical Therapy Evaluation Patient Details Name: Jonathon Lane MRN: [id number] DOB: 09-May-1934 Today's Date: 04/12/2012 Time: 1050-1057 PT Time Calculation (min): 7 min  PT Assessment / Plan / Recommendation Clinical Impression  Pt admitted for dehydration and hypernatremia. Pt eval'd and D/C'd on 11/2 with reorder 11/4. Pt more lethargic today and still unable to follow commands or participate with therapy at this time. Recommend daily ROM by nursing and discharge to SNF for longterm care. Pt at this time not appropriate for acute therapy due to lack of participation. Please reconsult should mental status change and pt able to participate. Signing off.     PT Assessment  Patent does not need any further PT services    Follow Up Recommendations       Does the patient have the potential to tolerate intense rehabilitation   No, Recommend SNF  Barriers to Discharge        Equipment Recommendations       Recommendations for Other Services     Frequency      Precautions / Restrictions Precautions Precautions: Fall   Pertinent Vitals/Pain PAINAD=0      Mobility  Bed Mobility Bed Mobility: Not assessed Details for Bed Mobility Assistance: pt non participatory and resistant with extremity movement and unable to attempt mobility +1total assist    Shoulder Instructions     Exercises General Exercises - Upper Extremity Shoulder Flexion: PROM;Both;10 reps;Supine Elbow Flexion: PROM;Both;10 reps;Supine Elbow Extension: PROM;Both;10 reps;Supine General Exercises - Lower Extremity Hip Flexion/Marching: PROM;Right;10 reps;Supine   PT Diagnosis:    PT Problem List:   PT Treatment Interventions:     PT Goals    Visit Information  Last PT Received On: 04/12/12 Assistance Needed: +1    Subjective Data  Subjective: pt non verbala and would not state name or answer any questions   Prior Functioning  Home Living Lives With: Other (Comment) (per chart was at [facility]  PTA) Available Help at Discharge: Skilled Nursing Facility    Cognition  Overall Cognitive Status: Difficult to assess Arousal/Alertness: Lethargic Cognition - Other Comments: Pt would open eyes today but would not follow commands or assist with any movement    Extremity/Trunk Assessment Right Upper Extremity Assessment RUE ROM/Strength/Tone: Deficits Left Upper Extremity Assessment LUE ROM/Strength/Tone: Deficits Right Lower Extremity Assessment RLE ROM/Strength/Tone: Deficits RLE ROM/Strength/Tone Deficits: pt ROM appears [facility] for hip and knee flexion allowing PROM today but no participation Left Lower Extremity Assessment LLE ROM/Strength/Tone: Deficits LLE ROM/Strength/Tone Deficits: pt resistant to any movement and only able to flex knee grossly 20 degrees due to resistance   Balance    End of Session PT - End of Session Activity Tolerance: Other (comment) (Pt limited by lethargy and unable to participate) Patient left: in bed;with bed alarm set;with call bell/phone within reach  GP     Delorse Lek 04/12/2012, 11:07 AM  Delaney Meigs, PT [phone]

## 2012-04-12 NOTE — Progress Notes (Signed)
[facility] Progress Note  SUBJECTIVE: not responding to verbal stimuli  OBJECTIVE:  Vital signs in last 24 hours: Temp:  [97.6 F (36.4 C)-99.2 F (37.3 C)] 97.6 F (36.4 C) (11/05 0640) Pulse Rate:  [78-83] 80  (11/05 0640) Resp:  [18] 18  (11/05 0640) BP: (110-131)/(66-81) 120/81 mmHg (11/05 0640) SpO2:  [95 %-97 %] 95 % (11/05 0640) Last BM Date: 04/10/12 General:    White male in NAD Heart:  Regular rate and rhythm Lungs: Respirations even and unlabored Abdomen:  Soft, nondistended  Some guarding of lower abdomen (tightens muscles).  A few bowel sounds.  Extremities:  Without edema. Neurologic:  Not responding to verbal stimuli.     Lab Results:  [facility] 04/12/12 0700 04/11/12 0033 04/10/12 1343  WBC 6.0 5.5 5.3  HGB 12.1* 11.4* 15.0  HCT 35.7* 34.4* 43.7  PLT 234 228 227   BMET  Basename 04/12/12 0700 04/11/12 0033  NA 135 141  K 4.0 4.2  CL 103 112  CO2 23 21  GLUCOSE 136* 187*  BUN 7 8  CREATININE 0.84 0.81  CALCIUM 7.9* 7.7*   LFT  Basename 04/11/12 0033  PROT 5.5*  ALBUMIN 1.9*  AST 53*  ALT 38  ALKPHOS 52  BILITOT 0.2*  BILIDIR --  IBILI --   Studies/Results: Mr Brain Wo Contrast  04/12/2012  *RADIOLOGY REPORT*  Clinical Data: Altered mental status.  Possible CVA. History of hypertension and diabetes.  History of dementia.  History of acute renal failure.  MRI HEAD WITHOUT CONTRAST  Technique:  Multiplanar, multiecho pulse sequences of the brain and surrounding structures were obtained according to standard protocol without intravenous contrast.  Comparison: Multiple prior CT scans, most recent 04/07/2012.  Findings: There is no evidence for acute infarction, intracranial hemorrhage, mass lesion, hydrocephalus, or extra-axial fluid. Moderate ventricular enlargement is accompanied by prominence of the cisterns and sulci along with prominent bilateral sylvian fissures consistent with hydrocephalus ex vacuo.  Increased signal in the  periventricular and subcortical white matter most likely represents chronic microvascular ischemic change.  Numerous supratentorial and infratentorial punctate foci of T2 shortening on T2* GRE sequence can be identified, representing remote microbleeds.  This is most commonly associated with hypertensive cerebrovascular disease.  Unremarkable pituitary, cerebellar tonsils, and upper cervical region.  Mild pannus.  No worrisome osseous findings.  Negative sinuses and mastoids.  Major intracranial vascular structures patent.  Subcentimeter cyst left parotid.  Compared with prior CTs, the appearance is similar.  IMPRESSION: Atrophy with chronic small vessel disease as described.   Multiple tiny chronic  microbleeds, sequelae of hypertensive cerebrovascular disease.  No acute stroke.  No cerebellar or posterior fossa mass lesion.   Original Report Authenticated By: Davonna Belling, M.D.    Ct Abdomen Pelvis W Contrast  04/10/2012  *RADIOLOGY REPORT*  Clinical Data: Unexplained abdominal pain.  Acute mental status changes, patient nonverbal.  Anorexia.  CT ABDOMEN AND PELVIS WITH CONTRAST  Technique:  Multidetector CT imaging of the abdomen and pelvis was performed following the standard protocol during bolus administration of intravenous contrast.  Contrast: OMNIPAQUE IOHEXOL 300 MG/ML. Oral contrast was not administered.  Comparison: None.  Findings: Images blurred by patient motion, and beam hardening streak artifact involving the upper abdomen due to the fact that the patient was not able raise the arms.  Wall thickening with mucosal enhancement involving the transverse colon, descending colon and proximal sigmoid colon. Scattered sigmoid colon diverticula.  Cecum positioned in the right upper quadrant.  Appendix  not clearly identified, but no pericecal inflammation.  Small bowel normal in appearance.  Stomach normal in appearance.  Small amount of ascites in the perihepatic region and minimally in the paracolic  gutters.  Normal appearing liver, spleen, and adrenal glands.  Mild diffuse pancreatic atrophy without focal pancreatic parenchymal abnormality.  Cortical thinning involving both kidneys consistent with age; bilateral extrarenal pelves, but no focal parenchymal abnormality involving either kidney.  Moderate to severe aorto- iliofemoral atherosclerosis without aneurysm.  No significant lymphadenopathy.  Urinary bladder distended, containing a small amount of gas. Moderate prostate gland enlargement; the Foley catheter balloon is inflated within the prostatic urethra.  Normal seminal vesicles. Small amount of presacral edema which may be dependent, as there is dependent edema in the subcutaneous tissues of the flanks and buttocks.  Bone window images demonstrate degenerative changes throughout the lower thoracic and lumbar spine, degenerative changes in both sacroiliac joints, and mild degenerative changes in both hips. Small bilateral pleural effusions and associated passive atelectasis in the lower lobes.  Heart enlarged with left ventricular predominance.  IMPRESSION:  1.  Colitis involving the transverse colon, descending colon, and proximal sigmoid colon. 2.  Small amount of ascites.  3.  Mild pancreatic atrophy. 4.  Mild diffuse cortical thinning involving both kidneys.  No focal renal parenchymal abnormalities. 5.  Foley catheter balloon inflated in the prostatic urethra.  This should be deflated and the catheter re-advanced.  Foley catheter position was telephoned to the patient's nurse on 6 [place], Randa Evens, at the time of interpretation on 04/10/2012 at 07/25/2018 8 hours.   Original Report Authenticated By: Hulan Saas, M.D.      ASSESSMENT / PLAN:   66. 78 year old white male with c-diff negative diarrhea in setting of left sided / transverse colitis on CTscan. Suspect infectious but ischemic not totally excluded. Absence of bleeding makes ischemic less likely. IBD low on list of differential  diagnoses. Only one stool yesterday and none today. Afebrile, normal WBC. He seems to be improving on  Cipro and Flagyl. Would continue antibiotics for total of 10 days.    2.  Dementia, not oriented to place or time. Unable to provide history . Apparently his mental status is worse than his baseline. Neurology to evaluate.          LOS: 6 days   Willette Cluster  04/12/2012, 10:38 AM

## 2012-04-12 NOTE — Progress Notes (Signed)
Nutrition Follow-up  Intervention:   1.  General healthful diet; encourage intake as able if pt is alert.  Assessment:   Pt admitted with AMS which is ongoing.  Pt with dementia, however has not returned to baseline.  Pt does not awaken to voice when RD enters room.    Diet advanced to Dysphagia 3, however intake is 0-25% of meals.  Pt remains on NS @ 75 mL/hr  Work-up ongoing to for etiology of AMS.  Pt with transverse colitis- infectious vs. Ischemic.  Continues on cipro and flagyl. Pt with 1 BM yesterday.  LP planned for this afternoon.  RD to follow.  Diet Order:  Dysphagia 3, thin liquids.  Meds: Scheduled Meds:   . antiseptic oral rinse  15 mL Mouth Rinse q12n4p  . chlorhexidine  15 mL Mouth Rinse BID  . ciprofloxacin  400 mg Intravenous Q12H  . collagenase   Topical Daily  . insulin aspart  0-15 Units Subcutaneous TID WC  . insulin glargine  25 Units Subcutaneous QHS  . [COMPLETED] LORazepam  1 mg Intravenous Once  . metronidazole  500 mg Intravenous Q8H  . saccharomyces boulardii  250 mg Oral BID  . sodium chloride  3 mL Intravenous Q12H   Continuous Infusions:   . sodium chloride 0.45 % with kcl 75 mL/hr at 04/11/12 1600   PRN Meds:.acetaminophen, acetaminophen, ondansetron (ZOFRAN) IV, ondansetron   CMP     Component Value Date/Time   NA 135 04/12/2012 0700   K 4.0 04/12/2012 0700   CL 103 04/12/2012 0700   CO2 23 04/12/2012 0700   GLUCOSE 136* 04/12/2012 0700   BUN 7 04/12/2012 0700   CREATININE 0.84 04/12/2012 0700   CALCIUM 7.9* 04/12/2012 0700   PROT 5.5* 04/11/2012 0033   ALBUMIN 1.9* 04/11/2012 0033   AST 53* 04/11/2012 0033   ALT 38 04/11/2012 0033   ALKPHOS 52 04/11/2012 0033   BILITOT 0.2* 04/11/2012 0033   GFRNONAA 83* 04/12/2012 0700   GFRAA >90 04/12/2012 0700    CBG (last 3)   Basename 04/12/12 1155 04/12/12 0951 04/11/12 2132  GLUCAP 136* 146* 115*     Intake/Output Summary (Last 24 hours) at 04/12/12 1244 Last data filed at 04/12/12 1107  Gross per 24 hour  Intake 2099.25 ml  Output   3102 ml  Net -1002.75 ml    Weight Status:  Stable at 175 lbs  Re-estimated needs:  1820-1990 kcal, 79-94g protein, >1.8 L/day  Nutrition Dx:  Inadequate oral intake, ongoing  Monitor:   1. Food/Beverage; pt to improve intake to eating as desired. Ongoing. 2. Wt/wt change; monitor trends.  Ongoing.   Loyce Dys, MS RD LDN Clinical Inpatient Dietitian Pager: [phone] Weekend/After hours pager: [phone]

## 2012-04-12 NOTE — Progress Notes (Signed)
Occupational Therapy Discharge Patient Details Name: Jonathon Lane MRN: [id number] DOB: 26-Jan-1934 Today's Date: 04/12/2012 Time:  -     Patient discharged from OT services secondary to MD order.  Please see latest therapy progress note for current level of functioning and progress toward goals.      GO     [name] 04/12/2012, 8:30 AM

## 2012-04-13 DIAGNOSIS — R4182 Altered mental status, unspecified: Secondary | ICD-10-CM

## 2012-04-13 LAB — GLUCOSE, CAPILLARY
Glucose-Capillary: 120 mg/dL — ABNORMAL HIGH (ref 70–99)
Glucose-Capillary: 127 mg/dL — ABNORMAL HIGH (ref 70–99)
Glucose-Capillary: 159 mg/dL — ABNORMAL HIGH (ref 70–99)

## 2012-04-13 LAB — FOLATE: Folate: 13 ng/mL (ref >5.4)

## 2012-04-13 LAB — HERPES SIMPLEX VIRUS(HSV) DNA BY PCR
HSV 1 DNA: NOT DETECTED
HSV 2 DNA: NOT DETECTED

## 2012-04-13 MED ORDER — CALCIUM GLUCONATE 10 % IV SOLN
1.0000 g | Freq: Once | INTRAVENOUS | Status: AC
  Administered 2012-04-13: 1 g via INTRAVENOUS
  Filled 2012-04-13: qty 10

## 2012-04-13 NOTE — Progress Notes (Signed)
[facility] Progress Note  SUBJECTIVE: awake, doesn't engage in meaningful conversation.   OBJECTIVE:  Vital signs in last 24 hours: Temp:  [98.2 F (36.8 C)-99 F (37.2 C)] 98.2 F (36.8 C) (11/06 0555) Pulse Rate:  [68-79] 68  (11/06 0555) Resp:  [18] 18  (11/06 0555) BP: (109-114)/(50-62) 114/62 mmHg (11/06 0555) SpO2:  [92 %-100 %] 100 % (11/06 0555) Last BM Date: 04/10/12 General:    white male in NAD Abdomen:  Soft, nontender and nondistended. Normal bowel sounds. Neurologic:  Alert, doesn't participate in any meaningful conversation.    Lab Results:  Basename 04/12/12 0700 04/11/12 0033 04/10/12 1343  WBC 6.0 5.5 5.3  HGB 12.1* 11.4* 15.0  HCT 35.7* 34.4* 43.7  PLT 234 228 227   BMET  Basename 04/12/12 0700 04/11/12 0033  NA 135 141  K 4.0 4.2  CL 103 112  CO2 23 21  GLUCOSE 136* 187*  BUN 7 8  CREATININE 0.84 0.81  CALCIUM 7.9* 7.7*   LFT  Basename 04/11/12 0033  PROT 5.5*  ALBUMIN 1.9*  AST 53*  ALT 38  ALKPHOS 52  BILITOT 0.2*  BILIDIR --  IBILI --    ASSESSMENT / PLAN: 1. C-diff negative diarrhea in setting of left sided / transverse colitis on CTscan. Suspect infectious since he improved on antibiotics.  No diarrhea, abdominal exam benign. Would continue Cipro/ flagyl for total of 10 days.   2. Dementia, not oriented to place or time. Unable to provide history . Apparently his mental status is worse than his baseline. Neurology evaluating. EEG reveals mild-moderate nonspecific generalized cerebral dysfunction. Patient is s/p LP yesterday.    Will sign off, call for questions. Thanks    LOS: 7 days   Willette Cluster  04/13/2012, 10:04 AM

## 2012-04-13 NOTE — Progress Notes (Signed)
Subjective: Patient awake but uncooperative.  Does speak when aggravated enough.   Objective: Current vital signs: BP 120/69  Pulse 83  Temp 98 F (36.7 C) (Axillary)  Resp 20  Ht 5\' 11"  (1.803 m)  Wt 79.7 kg (175 lb 11.3 oz)  BMI 24.51 kg/m2  SpO2 94% Vital signs in last 24 hours: Temp:  [98 F (36.7 C)-99 F (37.2 C)] 98 F (36.7 C) (11/06 1357) Pulse Rate:  [68-83] 83  (11/06 1357) Resp:  [18-20] 20  (11/06 1357) BP: (109-120)/(50-69) 120/69 mmHg (11/06 1357) SpO2:  [92 %-100 %] 94 % (11/06 1357)  Intake/Output from previous day: 11/05 0701 - 11/06 0700 In: 2173 [P.O.:100; I.V.:2073] Out: 2252 [Urine:2250; Stool:2] Intake/Output this shift: Total I/O In: 240 [P.O.:240] Out: 950 [Urine:950] Nutritional status: Dysphagia  Neurologic Exam: Mental Status: Alert.  Tells me to get away and not to bother him but does not follow commands.  Speech fluent although minimal.  Will not attempt to follow commands. Cranial Nerves: II: Discs flat bilaterally; blinks to bilateral confrontation III,IV, VI: ptosis not present, extra-ocular motions intact bilaterally V,VII: griamce symmetric,  VIII: unable to test IX,X: unable to test XI: unable to test XII: miunable to test Motor: Resists active movement of all extremities.  Keeps hands close to body.   Sensory: Responds to noxious stimuli throughout Plantars: Right: mute   Left: upgoing Cerebellar: unable to test  Lab Results: Basic Metabolic Panel:  Lab 04/12/12 1610 04/11/12 0033 04/09/12 0107 04/08/12 0438 04/07/12 2102  NA 135 141 152* 157* 154*  K 4.0 4.2 3.0* 3.1* 3.4*  CL 103 112 117* 125* 120*  CO2 23 21 23 23 24   GLUCOSE 136* 187* 136* 212* 166*  BUN 7 8 23  28* 30*  CREATININE 0.84 0.81 0.83 0.88 0.93  CALCIUM 7.9* 7.7* 7.9* -- --  MG 2.2 -- -- -- --  PHOS -- -- -- -- --    Liver Function Tests:  Lab 04/11/12 0033 04/08/12 0438  AST 53* 28  ALT 38 23  ALKPHOS 52 66  BILITOT 0.2* 0.2*  PROT 5.5*  5.9*  ALBUMIN 1.9* 1.8*   No results found for this basename: LIPASE:5,AMYLASE:5 in the last 168 hours  Lab 04/11/12 1133  AMMONIA 13    CBC:  Lab 04/12/12 0700 04/11/12 0033 04/10/12 1343 04/09/12 0107 04/08/12 0438  WBC 6.0 5.5 5.3 5.2 6.7  NEUTROABS -- -- -- -- --  HGB 12.1* 11.4* 15.0 12.8* 12.2*  HCT 35.7* 34.4* 43.7 39.4 37.6*  MCV 84.4 85.4 85.7 88.9 90.4  PLT 234 228 227 219 209    Cardiac Enzymes: No results found for this basename: CKTOTAL:5,CKMB:5,CKMBINDEX:5,TROPONINI:5 in the last 168 hours  Lipid Panel: No results found for this basename: CHOL:5,TRIG:5,HDL:5,CHOLHDL:5,VLDL:5,LDLCALC:5 in the last 168 hours  CBG:  Lab 04/13/12 1205 04/13/12 0800 04/12/12 2154 04/12/12 1758 04/12/12 1155  GLUCAP 127* 120* 134* 102* 136*    Microbiology: Results for orders placed during the hospital encounter of 04/06/12  URINE CULTURE     Status: Normal   Collection Time   04/06/12  9:55 AM      Component Value Range Status Comment   Specimen Description URINE, CATHETERIZED   Final    Special Requests NONE   Final    Culture  Setup Time 04/06/2012 15:40   Final    Colony Count NO GROWTH   Final    Culture NO GROWTH   Final    Report Status 04/07/2012 FINAL   Final  CULTURE, BLOOD (ROUTINE X 2)     Status: Normal   Collection Time   04/06/12 10:00 AM      Component Value Range Status Comment   Specimen Description BLOOD RIGHT ARM   Final    Special Requests BOTTLES DRAWN AEROBIC AND ANAEROBIC 10CC   Final    Culture  Setup Time 04/06/2012 15:22   Final    Culture NO GROWTH 5 DAYS   Final    Report Status 04/12/2012 FINAL   Final   CULTURE, BLOOD (ROUTINE X 2)     Status: Normal   Collection Time   04/06/12 10:12 AM      Component Value Range Status Comment   Specimen Description BLOOD RIGHT ARM   Final    Special Requests BOTTLES DRAWN AEROBIC AND ANAEROBIC 10CC   Final    Culture  Setup Time 04/06/2012 15:23   Final    Culture     Final    Value:  STAPHYLOCOCCUS SPECIES (COAGULASE NEGATIVE)     Note: THE SIGNIFICANCE OF ISOLATING THIS ORGANISM FROM A SINGLE SET OF BLOOD CULTURES WHEN MULTIPLE SETS ARE DRAWN IS UNCERTAIN. PLEASE NOTIFY THE MICROBIOLOGY DEPARTMENT WITHIN ONE WEEK IF SPECIATION AND SENSITIVITIES ARE REQUIRED.     Note: Gram Stain Report Called to,Read Back By and Verified With: TATA CARBONE @0910  04/07/12 BY KRAWS   Report Status 04/08/2012 FINAL   Final   MRSA PCR SCREENING     Status: Normal   Collection Time   04/06/12  9:03 PM      Component Value Range Status Comment   MRSA by PCR NEGATIVE  NEGATIVE Final   CLOSTRIDIUM DIFFICILE BY PCR     Status: Normal   Collection Time   04/09/12 11:25 PM      Component Value Range Status Comment   C difficile by pcr NEGATIVE  NEGATIVE Final   GRAM STAIN     Status: Normal   Collection Time   04/12/12  1:50 PM      Component Value Range Status Comment   Specimen Description CSF   Final    Special Requests 2.1ML   Final    Gram Stain     Final    Value: CYTOSPIN PREP     NO WBC SEEN     NO ORGANISMS SEEN   Report Status 04/12/2012 FINAL   Final   CSF CULTURE     Status: Normal (Preliminary result)   Collection Time   04/12/12  1:50 PM      Component Value Range Status Comment   Specimen Description CSF   Final    Special Requests 2.1ML   Final    Gram Stain     Final    Value: NO WBC SEEN     NO ORGANISMS SEEN     CYTOSPUN Performed at [facility]   Culture NO GROWTH 1 DAY   Final    Report Status PENDING   Incomplete     Coagulation Studies: No results found for this basename: LABPROT:5,INR:5 in the last 72 hours  Imaging: Mr Brain Wo Contrast  04/12/2012  *RADIOLOGY REPORT*  Clinical Data: Altered mental status.  Possible CVA. History of hypertension and diabetes.  History of dementia.  History of acute renal failure.  MRI HEAD WITHOUT CONTRAST  Technique:  Multiplanar, multiecho pulse sequences of the brain and surrounding structures were obtained  according to standard protocol without intravenous contrast.  Comparison: Multiple prior CT scans, most recent  04/07/2012.  Findings: There is no evidence for acute infarction, intracranial hemorrhage, mass lesion, hydrocephalus, or extra-axial fluid. Moderate ventricular enlargement is accompanied by prominence of the cisterns and sulci along with prominent bilateral sylvian fissures consistent with hydrocephalus ex vacuo.  Increased signal in the periventricular and subcortical white matter most likely represents chronic microvascular ischemic change.  Numerous supratentorial and infratentorial punctate foci of T2 shortening on T2* GRE sequence can be identified, representing remote microbleeds.  This is most commonly associated with hypertensive cerebrovascular disease.  Unremarkable pituitary, cerebellar tonsils, and upper cervical region.  Mild pannus.  No worrisome osseous findings.  Negative sinuses and mastoids.  Major intracranial vascular structures patent.  Subcentimeter cyst left parotid.  Compared with prior CTs, the appearance is similar.  IMPRESSION: Atrophy with chronic small vessel disease as described.   Multiple tiny chronic  microbleeds, sequelae of hypertensive cerebrovascular disease.  No acute stroke.  No cerebellar or posterior fossa mass lesion.   Original Report Authenticated By: Davonna Belling, M.D.    Dg Fluoro Guide Lumbar Puncture  04/12/2012  *RADIOLOGY REPORT*  Clinical Data: Altered mental status.  LUMBAR PUNCTURE FLUORO GUIDE  Comparison: No priors.  Findings: Informed consent was obtained by telephone from the patient's wife.  The risks, benefits and alternatives of the procedure were discussed.  She voiced understanding of the discussion and gave verbal consent to proceed (witnessed).  Subsequently, the patient was brought to the fluoroscopy suite and placed prone upon the fluoroscopy table.  A time-out procedure was performed.  Fluoroscopy was utilized to identify the L3-L4  interspace and a small mark was placed on the patient's skin.  The patient was then prepped and draped in the usual sterile fashion. A small amount of 1% lidocaine was infiltrated subcutaneously to provide local anesthesia.  Subsequently, under intermittent fluoroscopic guidance a 20-gauge spinal needle was introduced percutaneously into the L3-L4 interspace and clear CSF was obtained.  This CSF was stored in sequential vials until a total of 12 ml of clear cerebrospinal fluid was obtained.  The needle was removed from the patient and a small bandage was placed upon entry wound.  Estimated blood loss less than 1 ml.  No complications.  The specimens were sent to the laboratory for testing analysis per orders of the primary team.  IMPRESSION: 1.  Successful uncomplicated fluoroscopic guided lumbar puncture, as detailed above.   Original Report Authenticated By: Trudie Reed, M.D.     Medications:  I have reviewed the patient's current medications. Scheduled:   . acyclovir  800 mg Intravenous Q8H  . antiseptic oral rinse  15 mL Mouth Rinse q12n4p  . [COMPLETED] calcium gluconate  1 g Intravenous Once  . chlorhexidine  15 mL Mouth Rinse BID  . ciprofloxacin  400 mg Intravenous Q12H  . collagenase   Topical Daily  . insulin aspart  0-15 Units Subcutaneous TID WC  . insulin glargine  25 Units Subcutaneous QHS  . metronidazole  500 mg Intravenous Q8H  . saccharomyces boulardii  250 mg Oral BID  . sodium chloride  3 mL Intravenous Q12H    Assessment/Plan:  Patient Active Hospital Problem List:  Altered mental state (04/06/2012)   Assessment: On presentation patient with multiple metabolic abnormalities.  As of 11/4 these seem to be addressed.  Patient seems to be slowly improving but has not reached baseline.  Patient has a baseline dementia.  It is not unusual for demented patients to have mental status changes that lag behind correction of the  numbers.  It is encouraging that he is improving.   LP cultures are negative to date.  HSV titers are still pending.     Plan:  1.  Will f/u HSV and continue to follow with you  2.  Therapy   LOS: 7 days   Thana Farr, MD [facility] [phone] 04/13/2012  3:08 PM

## 2012-04-13 NOTE — Consult Note (Addendum)
Wound care follow-up:  Sacrum wound previously 100% slough/eschar.  Santyl has loosened nonviable tissue.  Performed conservative excisional sharp debridement using scissors and scalpel.  Pt tolerated with minimal discomfort and no bleeding.  Removed outer layer of slough.  Strong foul odor and mod tan drainage.  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 10% red, 90% slough/eschar, painful to touch. 5.5X3X.5cm  Plan: Physical therapy to begin hydrotherapy to remove nonviable tissue. Optimal plan of care would be for patient to remain in hospital until significant amt tissue is removed, or transfer to SNF where hydrotherapy is available. Continue Santyl for chemical debridement. Pt on air mattress to decrease pressure and has had nutrition consult.  Ct scan on 11/3 did not indicate osteomyelitis.   Cammie Mcgee, RN, MSN, [employer]  [phone]

## 2012-04-13 NOTE — Progress Notes (Signed)
[facility]                                                                                Patient Demographics  Jonathon Lane, is a 78 y.o. male  AOZ:[id number]  NGE:[id number]  DOB - 1933-09-30  Admit date - 04/06/2012  Admitting Physician Jonathon Llano, MD  Outpatient Primary MD for the patient is No primary provider on file.  LOS - 7   Chief Complaint  Patient presents with  . Altered Mental Status        Assessment & Plan   Assumed care of the patient on 04/11/2012 on day 5 of his hospital care    Brief narrative:    Jonathon Lane is a 78 y.o. male with past medical history of diabetes mellitus, dementia and hypertension. He was brought to the hospital from his nursing home because of altered mental status. His baseline is awake and alert, and apparently about a month ago he was able to walk according to the wife, about a week ago the nursing home staff noticed that he is more lethargic with poor oral intake. He was brought to the emergency department and evaluated on 04/01/2012, according to the notes his lethargy resolved and he was back to his baseline. That was believed to be secondary to Depakote. Since then the patient was not eating or drinking very well, according to his wife in the past 2 days he was getting more lethargic so he was brought back to the emergency department for further evaluation.    Initially in the ER she only responded to painful stimuli, thereafter in step down unit patient with supportive care with IV fluids and empiric antibiotics showed good improvement, step down physician Jonathon Lane canceled his lumbar puncture as clinical picture was not consistent with meningitis, he was then transferred to the floor where he was seen by my partner Jonathon Lane, will obtain CT abdomen pelvis consistent with colitis on 04/10/2012, I have taken over the patient on 04/11/2012, at this point I have consulted GI and started him on Cipro and  Flagyl IV for colitis, he is C. difficile negative.    I've also discussed his case with neurologist on call Jonathon Lane for his opinion in regards to patient's mental status change, he's not close to his 50 month old baseline of walking and feeding himself. In my opinion this could be natural course of declined of his baseline medical problem of advanced dementia, however have called neurology to rule out any reversible causes of mental status decline. MRI stable, LP ordered eventually on 04-12-12 after D/W Neuro.    Assessment/Plan:    Altered mental state   Could be natural course of declined of his advanced dementia, earlier clinically ruled out meningitis and LP was canceled by Jonathon Lane, his dehydration and hyponatremia better, however according to the wife patient was walking a month ago, will request neurology to evaluate the patient to rule out any reversible causes of mental status declined, mental status itself is better from what it was on admission but still not close to his 63 month old baseline. Neuro called, MRI stable changes suggestive of multiple chronic CVAs,  LP showing CSF with high protein count but only 1 WBC ? etiology, D/W with Jonathon Lane neurologist on 04/12/2012 plan is to empirically treat with acyclovir till we have HSV cultures.     Diarrhea/ abdominal pain   C. difficile PCR negative, CT abdomen pelvis on 04/10/2012 consistent with colitis, placed on IV Cipro Flagyl on 04/11/2012 and requested GI to following the patient.   Results for Jonathon, Lane (MRN [id number]) as of 04/13/2012 11:11  Ref. Range 04/12/2012 13:50  Glucose, CSF Latest Range: 43-76 mg/dL 71  Total  Protein, CSF Latest Range: 15-45 mg/dL 962 (H)  RBC Count, CSF Latest Range: 0 /cu mm 9 (H)  WBC, CSF Latest Range: 0-5 /cu mm 1  Segmented Neutrophils-CSF Latest Range: 0-6 % RARE  Lymphs, CSF Latest Range: 40-80 % FEW  Monocyte-Macrophage-Spinal Fluid Latest Range: 15-45 % RARE  Other  Cells, CSF No range found TOO FEW TO COUNT, SMEAR AVAILABLE FOR REVIEW  Appearance, CSF Latest Range: CLEAR  CLEAR  Color, CSF Latest Range: COLORLESS  COLORLESS  Supernatant No range found NOT INDICATED  Tube # No range found 3      Dehydration with hypernatremia   Source after IV fluids last BMP stable.    Sinus Bradycardia   Was on beta blocker, beta blockers on hold heart rate stable baseline EKG shows sinus rhythm with rate of 56 beats per minute. Stable blood pressure.    Fever/ Leukocytosis  *Fever & Leukocytosis have resolved  *Influenza panel PCR negative  * Urine culture negative   *Procalcitonin level is normal at 0.10   Question if it was due to colitis which is now evident on CT scan of abdomen pelvis     ? Bacteremia due to Gram-positive bacteria  *One out of two bottles has grown coag negative staph which is likely a contaminant - will observe off abx for now      Hypertension  Currently off of his blood pressure medications and beta blockers, heart rate on the lower side blood pressure currently stable will continue to monitor     Diabetes mellitus type 2, uncontrolled  Continue Lantus 25 units at hour of sleep and continue moderate sliding scale insulin and 15 units meal coverage  *Appreciate diabetes educator assistance  *Hemoglobin A1c is 7.3   CBG (last 3)   Basename 04/13/12 0800 04/12/12 2154 04/12/12 1758  GLUCAP 120* 134* 102*      Dementia  *Before admission to skilled nursing facility patient apparently was ambulatory  Was not on medications prior to admission      ARF (acute renal failure) next  Resolved , creatinine 1.5 to upon admission down to 0.83  *Seems related to dehydration with BUN and creatinine decreasing after hydration initiated      Acute respiratory failure with hypoxia  *Resolved , was likely due to decreased mental status *Continue supportive care with oxygen and pulmonary toileting  Request PT  reevaluate for aspiration risk evaluation on 04/11/2012    Decubitus ulcer of sacral region, unstageable  *Appreciate wound ostomy nurse evaluation  *Air mattress overlay has been ordered and recommendations are to begin Santyl ointment to chemically debride the area which is covered by 100% eschar. The area measures 5 x 3 cm diameter     Low ionized calcium, will be replaced IV on 04/13/2012 we'll repeat ionized calcium levels post replacement     Antibiotics:  Rocephin 10/30 >>> 04/08/12  Vancomycin 10/30 >>> 04/08/12  Levaquin 04/08/12 >>> 04/08/12  Zosyn 04/08/12 >>> 04/08/12  Cipro and Flagyl IV started on 04/11/2012     Code Status: DNR/DNI  Family Communication: Discussed with the wife on phone in detail on 04/11/2012  Disposition Plan: SNF    Procedures CT of the head, CT abdomen pelvis   Consults  GI, neurology, wound care   Time Spent in minutes   35   Antibiotics     Anti-infectives     Start     Dose/Rate Route Frequency Ordered Stop   04/12/12 1900   acyclovir (ZOVIRAX) 800 mg in dextrose 5 % 150 mL IVPB        800 mg 166 mL/hr over 60 Minutes Intravenous Every 8 hours 04/12/12 1822     04/11/12 1000   ciprofloxacin (CIPRO) IVPB 400 mg        400 mg 200 mL/hr over 60 Minutes Intravenous Every 12 hours 04/11/12 0907     04/11/12 1000   metroNIDAZOLE (FLAGYL) IVPB 500 mg        500 mg 100 mL/hr over 60 Minutes Intravenous Every 8 hours 04/11/12 0907     04/10/12 1400   metroNIDAZOLE (FLAGYL) tablet 500 mg  Status:  Discontinued        500 mg Oral 3 times per day 04/10/12 0947 04/10/12 1331   04/08/12 1000   piperacillin-tazobactam (ZOSYN) IVPB 3.375 g  Status:  Discontinued        3.375 g 12.5 mL/hr over 240 Minutes Intravenous Every 8 hours 04/08/12 0835 04/08/12 1220   04/08/12 1000   levofloxacin (LEVAQUIN) IVPB 750 mg  Status:  Discontinued        750 mg 100 mL/hr over 90 Minutes Intravenous Every 24 hours 04/08/12 0836 04/08/12 1220    04/07/12 1700   vancomycin (VANCOCIN) IVPB 1000 mg/200 mL premix  Status:  Discontinued        1,000 mg 200 mL/hr over 60 Minutes Intravenous Every 24 hours 04/06/12 2037 04/07/12 0812   04/07/12 0900   vancomycin (VANCOCIN) IVPB 1000 mg/200 mL premix  Status:  Discontinued        1,000 mg 200 mL/hr over 60 Minutes Intravenous Every 12 hours 04/07/12 0812 04/08/12 1220   04/07/12 0500   cefTRIAXone (ROCEPHIN) 2 g in dextrose 5 % 50 mL IVPB  Status:  Discontinued        2 g 100 mL/hr over 30 Minutes Intravenous Every 12 hours 04/06/12 2037 04/08/12 0835   04/06/12 1700   cefTRIAXone (ROCEPHIN) 2 g in dextrose 5 % 50 mL IVPB        2 g 100 mL/hr over 30 Minutes Intravenous  Once 04/06/12 1639 04/06/12 1808   04/06/12 1700   vancomycin (VANCOCIN) IVPB 1000 mg/200 mL premix        1,000 mg 200 mL/hr over 60 Minutes Intravenous  Once 04/06/12 1648 04/06/12 1908          Scheduled Meds:    . acyclovir  800 mg Intravenous Q8H  . antiseptic oral rinse  15 mL Mouth Rinse q12n4p  . chlorhexidine  15 mL Mouth Rinse BID  . ciprofloxacin  400 mg Intravenous Q12H  . collagenase   Topical Daily  . insulin aspart  0-15 Units Subcutaneous TID WC  . insulin glargine  25 Units Subcutaneous QHS  . metronidazole  500 mg Intravenous Q8H  . saccharomyces boulardii  250 mg Oral BID  . sodium chloride  3 mL Intravenous Q12H   Continuous Infusions:    .  sodium chloride 0.45 % with kcl 75 mL/hr at 04/11/12 1600   PRN Meds:.acetaminophen, acetaminophen, ondansetron (ZOFRAN) IV, ondansetron   DVT Prophylaxis    SCDs   Lab Results  Component Value Date   PLT 234 04/12/2012      Susa Raring K M.D on 04/13/2012 at 11:11 AM  Between 7am to 7pm - Pager - [phone]  After 7pm go to [email or web address] - password [email or web address]  And look for the night coverage person covering for me after hours  [facility] Office  [phone]    Subjective:   Jonathon Lane today remains to be a  unreliable historian however he looks comfortable, not serious 2 abdominal pain, denies headache   Objective:   Filed Vitals:   04/11/12 2148 04/12/12 0640 04/12/12 2156 04/13/12 0555  BP: 131/81 120/81 109/50 114/62  Pulse: 83 80 79 68  Temp: 97.9 F (36.6 C) 97.6 F (36.4 C) 99 F (37.2 C) 98.2 F (36.8 C)  TempSrc: Axillary  Axillary Axillary  Resp: 18 18 18 18   Height:      Weight:      SpO2: 96% 95% 92% 100%    Wt Readings from Last 3 Encounters:  04/07/12 79.7 kg (175 lb 11.3 oz)     Intake/Output Summary (Last 24 hours) at 04/13/12 1111 Last data filed at 04/13/12 0830  Gross per 24 hour  Intake   2173 ml  Output   2251 ml  Net    -78 ml    Exam Awake , responds a few basic commands and answer a few questions, No new F.N deficits, Normal affect Mildred.AT,PERRAL Supple Neck,No JVD, No cervical lymphadenopathy appriciated.  Symmetrical Chest wall movement, Good air movement bilaterally, CTAB RRR,No Gallops,Rubs or new Murmurs, No Parasternal Heave +ve B.Sounds, Abd Soft, mild generalized tenderness, No organomegaly appriciated, No rebound - guarding or rigidity. Foley catheter in place No Cyanosis, Clubbing or edema, No new Rash or bruise.   Data Review   Micro Results Recent Results (from the past 240 hour(s))  URINE CULTURE     Status: Normal   Collection Time   04/06/12  9:55 AM      Component Value Range Status Comment   Specimen Description URINE, CATHETERIZED   Final    Special Requests NONE   Final    Culture  Setup Time 04/06/2012 15:40   Final    Colony Count NO GROWTH   Final    Culture NO GROWTH   Final    Report Status 04/07/2012 FINAL   Final   CULTURE, BLOOD (ROUTINE X 2)     Status: Normal   Collection Time   04/06/12 10:00 AM      Component Value Range Status Comment   Specimen Description BLOOD RIGHT ARM   Final    Special Requests BOTTLES DRAWN AEROBIC AND ANAEROBIC 10CC   Final    Culture  Setup Time 04/06/2012 15:22   Final     Culture NO GROWTH 5 DAYS   Final    Report Status 04/12/2012 FINAL   Final   CULTURE, BLOOD (ROUTINE X 2)     Status: Normal   Collection Time   04/06/12 10:12 AM      Component Value Range Status Comment   Specimen Description BLOOD RIGHT ARM   Final    Special Requests BOTTLES DRAWN AEROBIC AND ANAEROBIC 10CC   Final    Culture  Setup Time 04/06/2012 15:23   Final    Culture  Final    Value: STAPHYLOCOCCUS SPECIES (COAGULASE NEGATIVE)     Note: THE SIGNIFICANCE OF ISOLATING THIS ORGANISM FROM A SINGLE SET OF BLOOD CULTURES WHEN MULTIPLE SETS ARE DRAWN IS UNCERTAIN. PLEASE NOTIFY THE MICROBIOLOGY DEPARTMENT WITHIN ONE WEEK IF SPECIATION AND SENSITIVITIES ARE REQUIRED.     Note: Gram Stain Report Called to,Read Back By and Verified With: TATA CARBONE @0910  04/07/12 BY KRAWS   Report Status 04/08/2012 FINAL   Final   MRSA PCR SCREENING     Status: Normal   Collection Time   04/06/12  9:03 PM      Component Value Range Status Comment   MRSA by PCR NEGATIVE  NEGATIVE Final   CLOSTRIDIUM DIFFICILE BY PCR     Status: Normal   Collection Time   04/09/12 11:25 PM      Component Value Range Status Comment   C difficile by pcr NEGATIVE  NEGATIVE Final   GRAM STAIN     Status: Normal   Collection Time   04/12/12  1:50 PM      Component Value Range Status Comment   Specimen Description CSF   Final    Special Requests 2.1ML   Final    Gram Stain     Final    Value: CYTOSPIN PREP     NO WBC SEEN     NO ORGANISMS SEEN   Report Status 04/12/2012 FINAL   Final   CSF CULTURE     Status: Normal (Preliminary result)   Collection Time   04/12/12  1:50 PM      Component Value Range Status Comment   Specimen Description CSF   Final    Special Requests 2.1ML   Final    Gram Stain     Final    Value: NO WBC SEEN     NO ORGANISMS SEEN     CYTOSPUN Performed at [facility]   Culture NO GROWTH 1 DAY   Final    Report Status PENDING   Incomplete     Radiology Reports Ct Head Wo  Contrast  04/07/2012  *RADIOLOGY REPORT*  Clinical Data: Altered mental status and confusion  CT HEAD WITHOUT CONTRAST  Technique:  Contiguous axial images were obtained from the base of the skull through the vertex without contrast.  Comparison: CT 04/01/2012  Findings: Moderately severe atrophy.  Diffuse ventricular enlargement is unchanged and most compatible with central atrophy.  No acute infarct.  Negative for hemorrhage or mass lesion.  No fluid collection.  Extensive carotid atherosclerotic calcification. Bilateral vertebral calcification.  No skull lesion.  IMPRESSION: Ventricular enlargement, compatible with atrophy.  No acute abnormality and no interval change.   Original Report Authenticated By: Janeece Riggers, M.D.    Ct Head Wo Contrast  04/01/2012  *RADIOLOGY REPORT*  Clinical Data: Pain.  CT HEAD WITHOUT CONTRAST  Technique:  Contiguous axial images were obtained from the base of the skull through the vertex without contrast.  Comparison: Head CT scan 03/05/2012.  Findings: There is some chronic microvascular ischemic change and cortical atrophy.  No evidence of acute abnormality including infarct, hemorrhage, mass lesion, mass effect, midline shift or abnormal extra-axial fluid collection.  No hydrocephalus or pneumocephalus.  The calvarium is intact.  Atherosclerosis is noted.  IMPRESSION: No acute finding.  Stable compared to prior exam.   Original Report Authenticated By: Bernadene Bell. Maricela Curet, M.D.    Ct Abdomen Pelvis W Contrast  04/10/2012  *RADIOLOGY REPORT*  Clinical Data: Unexplained abdominal pain.  Acute mental status changes, patient nonverbal.  Anorexia.  CT ABDOMEN AND PELVIS WITH CONTRAST  Technique:  Multidetector CT imaging of the abdomen and pelvis was performed following the standard protocol during bolus administration of intravenous contrast.  Contrast: OMNIPAQUE IOHEXOL 300 MG/ML. Oral contrast was not administered.  Comparison: None.  Findings: Images blurred by  patient motion, and beam hardening streak artifact involving the upper abdomen due to the fact that the patient was not able raise the arms.  Wall thickening with mucosal enhancement involving the transverse colon, descending colon and proximal sigmoid colon. Scattered sigmoid colon diverticula.  Cecum positioned in the right upper quadrant.  Appendix not clearly identified, but no pericecal inflammation.  Small bowel normal in appearance.  Stomach normal in appearance.  Small amount of ascites in the perihepatic region and minimally in the paracolic gutters.  Normal appearing liver, spleen, and adrenal glands.  Mild diffuse pancreatic atrophy without focal pancreatic parenchymal abnormality.  Cortical thinning involving both kidneys consistent with age; bilateral extrarenal pelves, but no focal parenchymal abnormality involving either kidney.  Moderate to severe aorto- iliofemoral atherosclerosis without aneurysm.  No significant lymphadenopathy.  Urinary bladder distended, containing a small amount of gas. Moderate prostate gland enlargement; the Foley catheter balloon is inflated within the prostatic urethra.  Normal seminal vesicles. Small amount of presacral edema which may be dependent, as there is dependent edema in the subcutaneous tissues of the flanks and buttocks.  Bone window images demonstrate degenerative changes throughout the lower thoracic and lumbar spine, degenerative changes in both sacroiliac joints, and mild degenerative changes in both hips. Small bilateral pleural effusions and associated passive atelectasis in the lower lobes.  Heart enlarged with left ventricular predominance.  IMPRESSION:  1.  Colitis involving the transverse colon, descending colon, and proximal sigmoid colon. 2.  Small amount of ascites.  3.  Mild pancreatic atrophy. 4.  Mild diffuse cortical thinning involving both kidneys.  No focal renal parenchymal abnormalities. 5.  Foley catheter balloon inflated in the prostatic  urethra.  This should be deflated and the catheter re-advanced.  Foley catheter position was telephoned to the patient's nurse on [address] [place], Randa Evens, at the time of interpretation on 04/10/2012 at 07/25/2018 8 hours.   Original Report Authenticated By: Hulan Saas, M.D.    Dg Chest Port 1 View  04/07/2012  *RADIOLOGY REPORT*  Clinical Data: Weakness, pneumonia  PORTABLE CHEST - 1 VIEW  Comparison: Chest radiograph 04/06/2012  Findings: Sternotomy wires overlie normal cardiac silhouette.  No effusion, infiltrate, or pneumothorax.  Mild basilar atelectasis on the left. No acute osseous abnormality.  IMPRESSION: Mild left basilar atelectasis.  No evidence of pneumonia.   Original Report Authenticated By: Genevive Bi, M.D.    Dg Chest Port 1 View  04/06/2012  *RADIOLOGY REPORT*  Clinical Data: Fever  PORTABLE CHEST - 1 VIEW  Comparison: None.  Findings: Cardiomediastinal silhouette is unremarkable.  Status post CABG.  No acute infiltrate or pleural effusion.  No pulmonary edema.  Degenerative changes right AC joint.  IMPRESSION: .  No active disease.  Status post CABG.   Original Report Authenticated By: Natasha Mead, M.D.     CBC  Lab 04/12/12 0700 04/11/12 0033 04/10/12 1343 04/09/12 0107 04/08/12 0438  WBC 6.0 5.5 5.3 5.2 6.7  HGB 12.1* 11.4* 15.0 12.8* 12.2*  HCT 35.7* 34.4* 43.7 39.4 37.6*  PLT 234 228 227 219 209  MCV 84.4 85.4 85.7 88.9 90.4  MCH 28.6 28.3 29.4 28.9 29.3  MCHC 33.9  33.1 34.3 32.5 32.4  RDW 13.5 13.3 13.2 13.6 14.1  LYMPHSABS -- -- -- -- --  MONOABS -- -- -- -- --  EOSABS -- -- -- -- --  BASOSABS -- -- -- -- --  BANDABS -- -- -- -- --    Chemistries   Lab 04/12/12 0700 04/11/12 0033 04/09/12 0107 04/08/12 0438 04/07/12 2102  NA 135 141 152* 157* 154*  K 4.0 4.2 3.0* 3.1* 3.4*  CL 103 112 117* 125* 120*  CO2 23 21 23 23 24   GLUCOSE 136* 187* 136* 212* 166*  BUN 7 8 23  28* 30*  CREATININE 0.84 0.81 0.83 0.88 0.93  CALCIUM 7.9* 7.7* 7.9* 7.9* 8.1*  MG 2.2 --  -- -- --  AST -- 53* -- 28 --  ALT -- 38 -- 23 --  ALKPHOS -- 52 -- 66 --  BILITOT -- 0.2* -- 0.2* --   ------------------------------------------------------------------------------------------------------------------ estimated creatinine clearance is 79.7 ml/min (by C-G formula based on Cr of 0.84). ------------------------------------------------------------------------------------------------------------------ No results found for this basename: HGBA1C:2 in the last 72 hours ------------------------------------------------------------------------------------------------------------------ No results found for this basename: CHOL:2,HDL:2,LDLCALC:2,TRIG:2,CHOLHDL:2,LDLDIRECT:2 in the last 72 hours ------------------------------------------------------------------------------------------------------------------  [facility] 04/11/12 1133  TSH 2.014  T4TOTAL --  T3FREE --  THYROIDAB --   ------------------------------------------------------------------------------------------------------------------ No results found for this basename: VITAMINB12:2,FOLATE:2,FERRITIN:2,TIBC:2,IRON:2,RETICCTPCT:2 in the last 72 hours  Coagulation profile  Lab 04/06/12 1712  INR 1.48  PROTIME --    No results found for this basename: DDIMER:2 in the last 72 hours  Cardiac Enzymes No results found for this basename: CK:3,CKMB:3,TROPONINI:3,MYOGLOBIN:3 in the last 168 hours ------------------------------------------------------------------------------------------------------------------ No components found with this basename: POCBNP:3

## 2012-04-14 LAB — BASIC METABOLIC PANEL
Calcium: 8.2 mg/dL — ABNORMAL LOW (ref 8.4–10.5)
GFR calc Af Amer: >90 mL/min (ref >90)
GFR calc non Af Amer: 85 mL/min — ABNORMAL LOW (ref >90)
Sodium: 136 mEq/L (ref 135–145)

## 2012-04-14 LAB — CBC
MCH: 28.9 pg (ref 26.0–34.0)
MCHC: 34 g/dL (ref 30.0–36.0)
Platelets: 282 10*3/uL (ref 150–400)
RDW: 13.7 % (ref 11.5–15.5)

## 2012-04-14 LAB — GLUCOSE, CAPILLARY
Glucose-Capillary: 158 mg/dL — ABNORMAL HIGH (ref 70–99)
Glucose-Capillary: 117 mg/dL — ABNORMAL HIGH (ref 70–99)
Glucose-Capillary: 153 mg/dL — ABNORMAL HIGH (ref 70–99)

## 2012-04-14 LAB — CALCIUM, IONIZED: Calcium, Ion: 1.12 mmol/L — ABNORMAL LOW (ref 1.12–1.32)

## 2012-04-14 LAB — MAGNESIUM: Magnesium: 2.3 mg/dL (ref 1.5–2.5)

## 2012-04-14 MED ORDER — COLLAGENASE 250 UNIT/GM EX OINT: TOPICAL_OINTMENT | Freq: Every day | CUTANEOUS | Status: DC

## 2012-04-14 MED ORDER — METRONIDAZOLE 500 MG PO TABS
500.0000 mg | ORAL_TABLET | Freq: Three times a day (TID) | ORAL | Status: DC
  Administered 2012-04-15: 500 mg via ORAL
  Filled 2012-04-14 (×5): qty 1

## 2012-04-14 MED ORDER — METRONIDAZOLE 500 MG PO TABS
500.0000 mg | ORAL_TABLET | Freq: Three times a day (TID) | ORAL | Status: DC
  Filled 2012-04-14 (×3): qty 1

## 2012-04-14 MED ORDER — METRONIDAZOLE 500 MG PO TABS: 500.0000 mg | ORAL_TABLET | Freq: Three times a day (TID) | ORAL | Status: DC

## 2012-04-14 MED ORDER — METOPROLOL TARTRATE 50 MG PO TABS: 25.0000 mg | ORAL_TABLET | Freq: Two times a day (BID) | ORAL | Status: DC

## 2012-04-14 MED ORDER — SACCHAROMYCES BOULARDII 250 MG PO CAPS: 250.0000 mg | ORAL_CAPSULE | Freq: Two times a day (BID) | ORAL | Status: DC

## 2012-04-14 MED ORDER — CIPROFLOXACIN HCL 500 MG PO TABS
500.0000 mg | ORAL_TABLET | Freq: Two times a day (BID) | ORAL | Status: DC
  Filled 2012-04-14 (×2): qty 1

## 2012-04-14 MED ORDER — HYDROCODONE-ACETAMINOPHEN 5-325 MG PO TABS
1.0000 | ORAL_TABLET | ORAL | Status: DC | PRN
  Administered 2012-04-14 – 2012-04-15 (×2): 1 via ORAL
  Filled 2012-04-14 (×2): qty 1

## 2012-04-14 MED ORDER — CIPROFLOXACIN HCL 500 MG PO TABS: 500.0000 mg | ORAL_TABLET | Freq: Two times a day (BID) | ORAL | Status: DC

## 2012-04-14 MED ORDER — CIPROFLOXACIN HCL 500 MG PO TABS
500.0000 mg | ORAL_TABLET | Freq: Two times a day (BID) | ORAL | Status: DC
  Administered 2012-04-14 – 2012-04-15 (×2): 500 mg via ORAL
  Filled 2012-04-14 (×4): qty 1

## 2012-04-14 MED ORDER — INSULIN DETEMIR 100 UNIT/ML ~~LOC~~ SOLN: 20.0000 [IU] | Freq: Every day | SUBCUTANEOUS | Status: DC

## 2012-04-14 NOTE — Discharge Instructions (Signed)
Follow with Primary MD in 3 days   Get CBC, CMP, checked 3 days by Primary MD and again as instructed by your Primary MD. Get a 2 view Chest X ray done next visit.  Get Medicines reviewed and adjusted.  Accuchecks 4 times/day, Once in AM empty stomach and then before each meal. Log in all results and show them to your Prim.MD in 3 days. If any glucose reading is under 80 or above 300 call your Prim MD immidiately. Follow Low glucose instructions for glucose under 80 as instructed.  Please request your Prim.MD to go over all Hospital Tests and Procedure/Radiological results at the follow up, please get all Hospital records sent to your Prim MD by signing hospital release before you go home.  Activity: As tolerated with Full fall precautions use walker/cane & assistance as needed   Diet:  Dysphagia 3 diet, with full  Feeding assistance and Aspiration precautions.  For Heart failure patients - Check your Weight same time everyday, if you gain over 2 pounds, or you develop in leg swelling, experience more shortness of breath or chest pain, call your Primary MD immediately. Follow Cardiac Low Salt Diet and 1.8 lit/day fluid restriction.  Disposition SNF  If you experience worsening of your admission symptoms, develop shortness of breath, life threatening emergency, suicidal or homicidal thoughts you must seek medical attention immediately by calling 911 or calling your MD immediately  if symptoms less severe.  You Must read complete instructions/literature along with all the possible adverse reactions/side effects for all the Medicines you take and that have been prescribed to you. Take any new Medicines after you have completely understood and accpet all the possible adverse reactions/side effects.

## 2012-04-14 NOTE — Progress Notes (Signed)
I have personally taken an interval history, reviewed the chart, and examined the patient.  I agree with the extender's note, impression and recommendations.  Nandi Tonnesen D. Starlin Steib, MD, FACG [facility] [phone]  

## 2012-04-14 NOTE — Discharge Summary (Addendum)
[facility]                                                                                   [name], is a 78 y.o. male  DOB [birth year]  MRN [id number].  Admission date:  04/06/2012  Discharge Date:  04/15/2012  Primary MD  No primary provider on file.  Admitting Physician  Clydia Llano, MD  Admission Diagnosis  Dehydration [276.51] Diabetes mellitus without complication [250.00] Hypernatremia [276.0] Hypertension [401.9] Dementia [294.20] Fever [780.60] Altered mental state  Discharge Diagnosis     Principal Problem:  *Dehydration with hypernatremia Active Problems:  Altered mental state  Hypertension  Diabetes mellitus type 2, uncontrolled  Dementia  Fever  ARF (acute renal failure)  Acute respiratory failure with hypoxia  Bacteremia due to Gram-positive bacteria  Nuchal rigidity  Leukocytosis  Decubitus ulcer of sacral region, unstageable  Sinus bradycardia  Hypokalemia  Nonspecific (abnormal) findings on radiological and other examination of gastrointestinal tract      Past Medical History  Diagnosis Date  . Hypertension   . Diabetes mellitus without complication   . Dementia   . BPH (benign prostatic hyperplasia)     History reviewed. No pertinent past surgical history.      Discharge Diagnoses:   Principal Problem:  *Dehydration with hypernatremia Active Problems:  Altered mental state  Hypertension  Diabetes mellitus type 2, uncontrolled  Dementia  Fever  ARF (acute renal failure)  Acute respiratory failure with hypoxia  Bacteremia due to Gram-positive bacteria  Nuchal rigidity  Leukocytosis  Decubitus ulcer of sacral region, unstageable  Sinus bradycardia  Hypokalemia  Nonspecific (abnormal) findings on radiological and other examination of gastrointestinal tract    Discharge Condition: Stable, long term prognosis poor   Diet recommendation: See Discharge Instructions below   Consults neurology, wound  care, GI   History of present illness and  Hospital Course:  See H&P, Labs, Consult and Test reports for all details in brief  -  Jonathon Lane is a 78 y.o. male with past medical history of diabetes mellitus, dementia and hypertension. He was brought to the hospital from his nursing home because of altered mental status. His baseline is awake and alert, and apparently about a month ago he was able to walk according to the wife, about a week ago the nursing home staff noticed that he is more lethargic with poor oral intake. He was brought to the emergency department and evaluated on 04/01/2012, according to the notes his lethargy resolved and he was back to his baseline. That was believed to be secondary to Depakote. Since then the patient was not eating or drinking very well, extensive neurological workup remained unremarkable, patient was seen by neurology, he still not close to his 42 month old baseline however has shown some improvement supportive care. He was also found to have colitis likely infectious in etiology on CT scan of the abdomen pelvis for which she's been treated with antibiotics and was seen by GI while he was here.   Have updated patient's daughter phone extensively prior to discharge. Including poor long term prognosis.   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 I discussed his case with Dr. Verlon Au neurologist on call today who suggested that patient's decreased mental status might be his new baseline due to his underlying progressive advanced dementia, however improvement of his colitis some changes might improve but he might be at a lower permanent baseline from here on  out. Any outpatient followup with neurology.    Results for [name], [name] (MRN [id number]) as of 04/13/2012 11:11   Ref. Range  04/12/2012 13:50   Glucose, CSF  Latest Range: 43-76 mg/dL  71   Total Protein, CSF  Latest Range: 15-45 mg/dL  409 (H)   RBC Count, CSF  Latest Range: 0 /cu mm  9 (H)   WBC, CSF  Latest Range: 0-5 /cu mm  1   Segmented Neutrophils-CSF  Latest Range: 0-6 %  RARE   Lymphs, CSF  Latest Range: 40-80 %  FEW   Monocyte-Macrophage-Spinal Fluid  Latest Range: 15-45 %  RARE   Other Cells, CSF  No range found  TOO FEW TO COUNT, SMEAR AVAILABLE FOR REVIEW   Appearance, CSF  Latest Range: CLEAR  CLEAR   Color, CSF  Latest Range: COLORLESS  COLORLESS   Supernatant  No range found  NOT INDICATED   Tube #  No range found  3      Diarrhea/ abdominal pain   C. difficile PCR negative, CT abdomen pelvis on 04/10/2012 consistent with colitis, placed on IV Cipro Flagyl on 04/11/2012 and requested GI to following the patient.    Dehydration with hypernatremia   Source after IV fluids last BMP stable.     Sinus Bradycardia   Was on beta blocker, beta blocker dose has been reduced in half continue to monitor heart rate and blood pressure adjust medications as needed.     ? Bacteremia due to Gram-positive bacteria  *One out of two bottles has grown coag negative staph which is likely a contaminant - will observe off abx for now     Diabetes mellitus type 2, uncontrolled   Continue Lantus 25 units at hour of sleep and continue moderate sliding scale insulin and 15 units meal coverage  *Appreciate diabetes educator assistance.Hemoglobin A1c is 7.3 , since by mouth intake is less I have reduced his Lantus dose, monitor sugars 3 times a day adjust Lantus and sliding-scale as needed.   CBG (last 3)   Basename 04/15/12 0811 04/14/12 2143 04/14/12 1711  GLUCAP 124* 207* 153*      ARF (acute renal failure) next   Due to prerenal azotemia resolved after IV  hydration.   Acute respiratory failure with hypoxia  *Resolved , was likely due to decreased mental status  *Continue supportive care with oxygen and pulmonary toileting  Tinea aspiration precautions with full feeding assistance, dysphagia 3 diet, raise head of the bed 90 while feeding.   Decubitus ulcer of sacral region, unstageable   *Appreciate wound ostomy nurse evaluation  *Air mattress overlay has been ordered and recommendations are to begin Santyl ointment to chemically debride the area which is covered by 100% eschar. The area measures 5 x 3 cm diameter . Continue wound care and air mattress overlay.   Hypocalcemia - has been replaced IV yesterday, corrected calcium  stable today      Today   Subjective:   Jonathon Lane today has no headache,no chest abdominal pain,no new weakness tingling or numbness.   Objective:   Blood pressure 118/72, pulse 69, temperature 97.8 F (36.6 C), temperature source Axillary, resp. rate 20, height 5\' 11"  (1.803 m), weight 79.7 kg (175 lb 11.3 oz), SpO2 100.00%.   Intake/Output Summary (Last 24 hours) at 04/15/12 1039 Last data filed at 04/15/12 0545  Gross per 24 hour  Intake    840 ml  Output   2150 ml  Net  -1310 ml    Exam  Awake , remained somewhat drowsy, No new F.N deficits, Normal affect [place].AT,PERRAL Supple Neck,No JVD, No cervical lymphadenopathy appriciated.  Symmetrical Chest wall movement, Good air movement bilaterally, CTAB RRR,No Gallops,Rubs or new Murmurs, No Parasternal Heave +ve B.Sounds, Abd Soft, Non tender, No organomegaly appriciated, No rebound -guarding or rigidity. No Cyanosis, Clubbing or edema, No new Rash or bruise   Data Review   Ct Head Wo Contrast  04/07/2012  *RADIOLOGY REPORT*  Clinical Data: Altered mental status and confusion  CT HEAD WITHOUT CONTRAST  Technique:  Contiguous axial images were obtained from the base of the skull through the vertex without contrast.  Comparison: CT  04/01/2012  Findings: Moderately severe atrophy.  Diffuse ventricular enlargement is unchanged and most compatible with central atrophy.  No acute infarct.  Negative for hemorrhage or mass lesion.  No fluid collection.  Extensive carotid atherosclerotic calcification. Bilateral vertebral calcification.  No skull lesion.  IMPRESSION: Ventricular enlargement, compatible with atrophy.  No acute abnormality and no interval change.   Original Report Authenticated By: Janeece Riggers, M.D.    Ct Head Wo Contrast  04/01/2012  *RADIOLOGY REPORT*  Clinical Data: Pain.  CT HEAD WITHOUT CONTRAST  Technique:  Contiguous axial images were obtained from the base of the skull through the vertex without contrast.  Comparison: Head CT scan 03/05/2012.  Findings: There is some chronic microvascular ischemic change and cortical atrophy.  No evidence of acute abnormality including infarct, hemorrhage, mass lesion, mass effect, midline shift or abnormal extra-axial fluid collection.  No hydrocephalus or pneumocephalus.  The calvarium is intact.  Atherosclerosis is noted.  IMPRESSION: No acute finding.  Stable compared to prior exam.   Original Report Authenticated By: Bernadene Bell. Maricela Curet, M.D.    Mr Brain Wo Contrast  04/12/2012  *RADIOLOGY REPORT*  Clinical Data: Altered mental status.  Possible CVA. History of hypertension and diabetes.  History of dementia.  History of acute renal failure.  MRI HEAD WITHOUT CONTRAST  Technique:  Multiplanar, multiecho pulse sequences of the brain and surrounding structures were obtained according to standard protocol without intravenous contrast.  Comparison: Multiple prior CT scans, most recent 04/07/2012.  Findings: There is no evidence for acute infarction, intracranial hemorrhage, mass lesion, hydrocephalus, or extra-axial fluid. Moderate ventricular enlargement is accompanied by prominence of the cisterns and sulci along with prominent bilateral sylvian fissures consistent with hydrocephalus  ex vacuo.  Increased signal in the periventricular and subcortical white matter most likely represents chronic microvascular ischemic change.  Numerous supratentorial and infratentorial punctate foci of T2 shortening on T2* GRE sequence can be identified, representing remote microbleeds.  This is most commonly associated with hypertensive cerebrovascular disease.  Unremarkable pituitary, cerebellar tonsils, and upper cervical region.  Mild pannus.  No worrisome osseous findings.  Negative sinuses and mastoids.  Major intracranial vascular structures patent.  Subcentimeter cyst left parotid.  Compared with prior CTs, the appearance  is similar.  IMPRESSION: Atrophy with chronic small vessel disease as described.   Multiple tiny chronic  microbleeds, sequelae of hypertensive cerebrovascular disease.  No acute stroke.  No cerebellar or posterior fossa mass lesion.   Original Report Authenticated By: Davonna Belling, M.D.    Ct Abdomen Pelvis W Contrast  04/10/2012  *RADIOLOGY REPORT*  Clinical Data: Unexplained abdominal pain.  Acute mental status changes, patient nonverbal.  Anorexia.  CT ABDOMEN AND PELVIS WITH CONTRAST  Technique:  Multidetector CT imaging of the abdomen and pelvis was performed following the standard protocol during bolus administration of intravenous contrast.  Contrast: OMNIPAQUE IOHEXOL 300 MG/ML. Oral contrast was not administered.  Comparison: None.  Findings: Images blurred by patient motion, and beam hardening streak artifact involving the upper abdomen due to the fact that the patient was not able raise the arms.  Wall thickening with mucosal enhancement involving the transverse colon, descending colon and proximal sigmoid colon. Scattered sigmoid colon diverticula.  Cecum positioned in the right upper quadrant.  Appendix not clearly identified, but no pericecal inflammation.  Small bowel normal in appearance.  Stomach normal in appearance.  Small amount of ascites in the perihepatic  region and minimally in the paracolic gutters.  Normal appearing liver, spleen, and adrenal glands.  Mild diffuse pancreatic atrophy without focal pancreatic parenchymal abnormality.  Cortical thinning involving both kidneys consistent with age; bilateral extrarenal pelves, but no focal parenchymal abnormality involving either kidney.  Moderate to severe aorto- iliofemoral atherosclerosis without aneurysm.  No significant lymphadenopathy.  Urinary bladder distended, containing a small amount of gas. Moderate prostate gland enlargement; the Foley catheter balloon is inflated within the prostatic urethra.  Normal seminal vesicles. Small amount of presacral edema which may be dependent, as there is dependent edema in the subcutaneous tissues of the flanks and buttocks.  Bone window images demonstrate degenerative changes throughout the lower thoracic and lumbar spine, degenerative changes in both sacroiliac joints, and mild degenerative changes in both hips. Small bilateral pleural effusions and associated passive atelectasis in the lower lobes.  Heart enlarged with left ventricular predominance.  IMPRESSION:  1.  Colitis involving the transverse colon, descending colon, and proximal sigmoid colon. 2.  Small amount of ascites.  3.  Mild pancreatic atrophy. 4.  Mild diffuse cortical thinning involving both kidneys.  No focal renal parenchymal abnormalities. 5.  Foley catheter balloon inflated in the prostatic urethra.  This should be deflated and the catheter re-advanced.  Foley catheter position was telephoned to the patient's nurse on 6 [place], Randa Evens, at the time of interpretation on 04/10/2012 at 07/25/2018 8 hours.   Original Report Authenticated By: Hulan Saas, M.D.    Dg Chest Port 1 View  04/07/2012  *RADIOLOGY REPORT*  Clinical Data: Weakness, pneumonia  PORTABLE CHEST - 1 VIEW  Comparison: Chest radiograph 04/06/2012  Findings: Sternotomy wires overlie normal cardiac silhouette.  No effusion,  infiltrate, or pneumothorax.  Mild basilar atelectasis on the left. No acute osseous abnormality.  IMPRESSION: Mild left basilar atelectasis.  No evidence of pneumonia.   Original Report Authenticated By: Genevive Bi, M.D.    Dg Chest Port 1 View  04/06/2012  *RADIOLOGY REPORT*  Clinical Data: Fever  PORTABLE CHEST - 1 VIEW  Comparison: None.  Findings: Cardiomediastinal silhouette is unremarkable.  Status post CABG.  No acute infiltrate or pleural effusion.  No pulmonary edema.  Degenerative changes right AC joint.  IMPRESSION: .  No active disease.  Status post CABG.   Original Report Authenticated By:  Natasha Mead, M.D.    Dg Fluoro Guide Lumbar Puncture  04/12/2012  *RADIOLOGY REPORT*  Clinical Data: Altered mental status.  LUMBAR PUNCTURE FLUORO GUIDE  Comparison: No priors.  Findings: Informed consent was obtained by telephone from the patient's wife.  The risks, benefits and alternatives of the procedure were discussed.  She voiced understanding of the discussion and gave verbal consent to proceed (witnessed).  Subsequently, the patient was brought to the fluoroscopy suite and placed prone upon the fluoroscopy table.  A time-out procedure was performed.  Fluoroscopy was utilized to identify the L3-L4 interspace and a small mark was placed on the patient's skin.  The patient was then prepped and draped in the usual sterile fashion. A small amount of 1% lidocaine was infiltrated subcutaneously to provide local anesthesia.  Subsequently, under intermittent fluoroscopic guidance a 20-gauge spinal needle was introduced percutaneously into the L3-L4 interspace and clear CSF was obtained.  This CSF was stored in sequential vials until a total of 12 ml of clear cerebrospinal fluid was obtained.  The needle was removed from the patient and a small bandage was placed upon entry wound.  Estimated blood loss less than 1 ml.  No complications.  The specimens were sent to the laboratory for testing analysis per  orders of the primary team.  IMPRESSION: 1.  Successful uncomplicated fluoroscopic guided lumbar puncture, as detailed above.   Original Report Authenticated By: Trudie Reed, M.D.     EEG Diagnosis:   1) Generalized slow activity  2) Slow PDR   Clinical Interpretation: This normal EEG is recorded in the waking and drowsy state. There is evidence of a mild-moderate nonspecific generalized cerebral dysfunction. There was no seizure or seizure predisposition recorded on this study.     Micro Results      Recent Results (from the past 240 hour(s))  URINE CULTURE     Status: Normal   Collection Time   04/06/12  9:55 AM      Component Value Range Status Comment   Specimen Description URINE, CATHETERIZED   Final    Special Requests NONE   Final    Culture  Setup Time 04/06/2012 15:40   Final    Colony Count NO GROWTH   Final    Culture NO GROWTH   Final    Report Status 04/07/2012 FINAL   Final   CULTURE, BLOOD (ROUTINE X 2)     Status: Normal   Collection Time   04/06/12 10:00 AM      Component Value Range Status Comment   Specimen Description BLOOD RIGHT ARM   Final    Special Requests BOTTLES DRAWN AEROBIC AND ANAEROBIC 10CC   Final    Culture  Setup Time 04/06/2012 15:22   Final    Culture NO GROWTH 5 DAYS   Final    Report Status 04/12/2012 FINAL   Final   CULTURE, BLOOD (ROUTINE X 2)     Status: Normal   Collection Time   04/06/12 10:12 AM      Component Value Range Status Comment   Specimen Description BLOOD RIGHT ARM   Final    Special Requests BOTTLES DRAWN AEROBIC AND ANAEROBIC 10CC   Final    Culture  Setup Time 04/06/2012 15:23   Final    Culture     Final    Value: STAPHYLOCOCCUS SPECIES (COAGULASE NEGATIVE)     Note: THE SIGNIFICANCE OF ISOLATING THIS ORGANISM FROM A SINGLE SET OF BLOOD CULTURES WHEN MULTIPLE SETS ARE  DRAWN IS UNCERTAIN. PLEASE NOTIFY THE MICROBIOLOGY DEPARTMENT WITHIN ONE WEEK IF SPECIATION AND SENSITIVITIES ARE REQUIRED.     Note: Gram Stain  Report Called to,Read Back By and Verified With: TATA CARBONE @0910  04/07/12 BY KRAWS   Report Status 04/08/2012 FINAL   Final   MRSA PCR SCREENING     Status: Normal   Collection Time   04/06/12  9:03 PM      Component Value Range Status Comment   MRSA by PCR NEGATIVE  NEGATIVE Final   CLOSTRIDIUM DIFFICILE BY PCR     Status: Normal   Collection Time   04/09/12 11:25 PM      Component Value Range Status Comment   C difficile by pcr NEGATIVE  NEGATIVE Final   GRAM STAIN     Status: Normal   Collection Time   04/12/12  1:50 PM      Component Value Range Status Comment   Specimen Description CSF   Final    Special Requests 2.1ML   Final    Gram Stain     Final    Value: CYTOSPIN PREP     NO WBC SEEN     NO ORGANISMS SEEN   Report Status 04/12/2012 FINAL   Final   CSF CULTURE     Status: Normal (Preliminary result)   Collection Time   04/12/12  1:50 PM      Component Value Range Status Comment   Specimen Description CSF   Final    Special Requests 2.1ML   Final    Gram Stain     Final    Value: NO WBC SEEN     NO ORGANISMS SEEN     CYTOSPUN Performed at [facility]   Culture NO GROWTH 2 DAYS   Final    Report Status PENDING   Incomplete      CBC w Diff: Lab Results  Component Value Date   WBC 7.7 04/15/2012   HGB 13.3 04/15/2012   HCT 38.6* 04/15/2012   PLT 284 04/15/2012   LYMPHOPCT 21 04/06/2012   MONOPCT 11 04/06/2012   EOSPCT 1 04/06/2012   BASOPCT 0 04/06/2012    CMP: Lab Results  Component Value Date   NA 135 04/15/2012   K 4.7 04/15/2012   CL 102 04/15/2012   CO2 22 04/15/2012   BUN 7 04/15/2012   CREATININE 0.81 04/15/2012   PROT 5.5* 04/11/2012   ALBUMIN 1.9* 04/11/2012   BILITOT 0.2* 04/11/2012   ALKPHOS 52 04/11/2012   AST 53* 04/11/2012   ALT 38 04/11/2012  .   Discharge Instructions     Follow with Primary MD in 3 days   Get CBC, CMP, checked 3 days by Primary MD and again as instructed by your Primary MD. Get a 2 view Chest X ray done next  visit.  Get Medicines reviewed and adjusted.  Accuchecks 4 times/day, Once in AM empty stomach and then before each meal. Log in all results and show them to your Prim.MD in 3 days. If any glucose reading is under 80 or above 300 call your Prim MD immidiately. Follow Low glucose instructions for glucose under 80 as instructed.   Please request your Prim.MD to go over all Hospital Tests and Procedure/Radiological results at the follow up, please get all Hospital records sent to your Prim MD by signing hospital release before you go home.  Activity: As tolerated with Full fall precautions use walker/cane & assistance as needed   Diet:  Dysphagia 3 diet, with full  Feeding assistance and Aspiration precautions.  For Heart failure patients - Check your Weight same time everyday, if you gain over 2 pounds, or you develop in leg swelling, experience more shortness of breath or chest pain, call your Primary MD immediately. Follow Cardiac Low Salt Diet and 1.8 lit/day fluid restriction.  Disposition SNF  If you experience worsening of your admission symptoms, develop shortness of breath, life threatening emergency, suicidal or homicidal thoughts you must seek medical attention immediately by calling 911 or calling your MD immediately  if symptoms less severe.  You Must read complete instructions/literature along with all the possible adverse reactions/side effects for all the Medicines you take and that have been prescribed to you. Take any new Medicines after you have completely understood and accpet all the possible adverse reactions/side effects.    Follow-up Information    Follow up with Your primary care physician. Schedule an appointment as soon as possible for a visit in 3 days.      Follow up with Evie Lacks, MD. Schedule an appointment as soon as possible for a visit in 1 week.   Contact information:   [address] [place] [address] [phone]       Follow up  with Melvia Heaps, MD. Schedule an appointment as soon as possible for a visit in 1 week.   Contact information:   [address] [place] [address] [phone]            Discharge Medications    Alexanderjames, Berg  Home Medication Instructions [id number]   Printed on:04/14/12 1033  Medication Information                    Tamsulosin HCl (FLOMAX) 0.4 MG CAPS Take 0.4 mg by mouth every evening.            pravastatin (PRAVACHOL) 40 MG tablet Take 40 mg by mouth every evening.            aspirin EC 81 MG tablet Take 81 mg by mouth daily.           cholecalciferol (VITAMIN D) 1000 UNITS tablet Take 1,000 Units by mouth daily.           vitamin B-12 (CYANOCOBALAMIN) 1000 MCG tablet Take 1,000 mcg by mouth daily.           docusate sodium (COLACE) 100 MG capsule Take 100 mg by mouth daily.           finasteride (PROSCAR) 5 MG tablet Take 5 mg by mouth daily.           furosemide (LASIX) 20 MG tablet Take 20 mg by mouth daily.           Multiple Vitamin (MULTIVITAMIN WITH MINERALS) TABS Take 1 tablet by mouth daily.           nystatin (MYCOSTATIN) powder Apply 1 g topically 2 (two) times daily. Apply to Peri area until healed           PRESCRIPTION MEDICATION Apply 1 mg topically daily as needed. For agitation   Ativan Topical Gel           insulin aspart (NOVOLOG) 100 UNIT/ML injection Inject 2-10 Units into the skin 3 (three) times daily before meals. Per sliding scale: CBG 100-200= 2 units 201-250= 4 units 251-300= 6 units 301-350= 8 units 351-400= 10 units  Chloroxylenol-Zinc Oxide (BAZA EX) Apply 1 application topically as needed. For incontinence care           insulin detemir (LEVEMIR) 100 UNIT/ML injection Inject 20 Units into the skin daily. Hold if CBG < 120.           collagenase (SANTYL) ointment Apply topically daily.           saccharomyces boulardii (FLORASTOR) 250 MG capsule Take 1 capsule (250 mg  total) by mouth 2 (two) times daily.           ciprofloxacin (CIPRO) 500 MG tablet Take 1 tablet (500 mg total) by mouth 2 (two) times daily. For 7 more days           metroNIDAZOLE (FLAGYL) 500 MG tablet Take 1 tablet (500 mg total) by mouth 3 (three) times daily. For 7 more days           metoprolol (LOPRESSOR) 50 MG tablet Take 0.5 tablets (25 mg total) by mouth 2 (two) times daily.              Total Time in preparing paper work, data evaluation and todays exam - 35 minutes  Jonathon Lane M.D on 04/15/2012 at 10:39 AM  [facility] Office  [phone]

## 2012-04-14 NOTE — Clinical Social Work Note (Signed)
Clinical Social Worker spoke with various family members at length regarding discharge through out the day today. The patient's wife reported that she has difficulty getting around to visit the facilities and requested that the patient's daughter Waynetta Sandy decide on SNF placement. CSW met with Waynetta Sandy today at 2:30pm and Waynetta Sandy reported that she legally cannot make that decision, because Mrs. Samaras's is his legal guardian. CSW and [place] conference called Mrs. Zakrzewski's to discuss placement options and it was decided for [facility] to visit [facility] and [facility] today. No decision has been made as of yet. CSW will continue to follow.   Rozetta Nunnery MSW, [employer] [phone]

## 2012-04-14 NOTE — Progress Notes (Signed)
PT HYDROTHERAPY EVALUATION NOTE    04/14/12 0925  Subjective Assessment  Subjective Pt lethargic no subjective obtained  Patient and Family Stated Goals unable to obtain  Date of Onset (prior to admission)  Prior Treatments unknown  Evaluation and Treatment  Evaluation and Treatment Procedures Explained to Patient/Family (pt non-coherent to receive explanation)  Evaluation and Treatment Procedures Patient unable to consent due to mental status  Pressure Ulcer 04/06/12 Unstageable - Full thickness tissue loss in which the base of the ulcer is covered by slough (yellow, tan, gray, green or brown) and/or eschar (tan, brown or black) in the wound bed. Pink edges with purulent, blackish wound base.   Date First Assessed/Time First Assessed: 04/06/12 2020   Location: Sacrum  Location Orientation: Mid  Staging: Unstageable - Full thickness tissue loss in which the base of the ulcer is covered by slough (yellow, tan, gray, green or brown) and/or eschar   State of Healing Eschar  Site / Wound Assessment Yellow;Pink;Black  % Wound base Red or Granulating 10%  % Wound base Yellow 60%  % Wound base Black 30%  Peri-wound Assessment Erythema (blanchable)  Wound Length (cm) 5.5 cm  Wound Width (cm) 4 cm  Wound Depth (cm) 3 cm  Margins Unattacted edges (unapproximated)  Drainage Amount Minimal  Drainage Description Odor;Purulent  Treatment Hydrotherapy (Pulse lavage);Debridement (Selective);Packing (Saline gauze) (with Santyl)  Dressing Type Gauze (Comment) (moist to dry with Santyl)  Dressing Clean;Dry;Intact  Hydrotherapy  Pulsed Lavage with Suction (psi) 12 psi  Pulsed Lavage with Suction - Normal Saline Used 1000 mL  Pulsed Lavage Tip Tip with splash shield  Pulsed lavage therapy - wound location sacrum  Selective Debridement  Selective Debridement - Location sacrum  Selective Debridement - Tools Used Forceps;Scissors  Selective Debridement - Tissue Removed eschar and necrotic tissue    Wound Therapy - Assess/Plan/Recommendations  Wound Therapy - Clinical Statement Pt 78 y.o. male presents with unstageable sacral wound.  Wound bed consists of eshar, yellow and white necrotic tissues.  Pt will benefit from hydrotherapy to facilitate wound healing and debridement to remove unviable tissue.  Will continue to use Santyl as indicated to facilitate further debridement.  Will continue to see 6x/wk as indicated.  Wound Therapy - Functional Problem List decreased skin intergrity  Factors Delaying/Impairing Wound Healing Altered sensation;Diabetes Mellitus;Infection - systemic/local;Vascular compromise;Immobility  Hydrotherapy Plan Debridement;Dressing change;Pulsatile lavage with suction;Patient/family education  Wound Therapy - Frequency 6X / week  Wound Therapy - Follow Up Recommendations Skilled nursing facility  Wound Plan continue as indicated  Wound Therapy Goals - Improve the function of patient's integumentary system by progressing the wound(s) through the phases of wound healing by:  Decrease Necrotic Tissue to < 50%  Decrease Necrotic Tissue - Progress Goal set today  Increase Granulation Tissue to > 50%  Increase Granulation Tissue - Progress Goal set today  Goals/treatment plan/discharge plan were made with and agreed upon by patient/family No, Patient unable to participate in goals/treatment/discharge plan and family unavailable  Time For Goal Achievement 7 days  Wound Therapy - Potential for Goals Good    Pt does not appear to be in pain/discomfort; no premedication required prior to treatment session.  Charlotte Crumb, PT DPT  [phone]

## 2012-04-15 LAB — BASIC METABOLIC PANEL
CO2: 22 mEq/L (ref 19–32)
Calcium: 8.2 mg/dL — ABNORMAL LOW (ref 8.4–10.5)
Creatinine, Ser: 0.81 mg/dL (ref 0.50–1.35)
GFR calc non Af Amer: 84 mL/min — ABNORMAL LOW (ref >90)
Sodium: 135 mEq/L (ref 135–145)

## 2012-04-15 LAB — CBC
MCH: 29.7 pg (ref 26.0–34.0)
MCHC: 34.5 g/dL (ref 30.0–36.0)
MCV: 86.2 fL (ref 78.0–100.0)
Platelets: 284 10*3/uL (ref 150–400)
RBC: 4.48 MIL/uL (ref 4.22–5.81)

## 2012-04-15 LAB — GLUCOSE, CAPILLARY: Glucose-Capillary: 124 mg/dL — ABNORMAL HIGH (ref 70–99)

## 2012-04-15 LAB — CSF CULTURE W GRAM STAIN: Gram Stain: NONE SEEN

## 2012-04-15 LAB — MAGNESIUM: Magnesium: 2.3 mg/dL (ref 1.5–2.5)

## 2012-04-15 NOTE — Progress Notes (Signed)
Report called to [facility]. Discharged by ambulance at 1200.Irena Reichmann 04/15/2012

## 2012-04-15 NOTE — Clinical Social Work Note (Signed)
Clinical Social Worker spoke with patient's wife and daughter Waynetta Sandy regarding choice for SNF. They chose [facility]. CSW will facilitate discharge to SNF and patient will [facility].DNR is placed inside discharge packet.   Rozetta Nunnery MSW, [employer] [phone]

## 2012-04-15 NOTE — Progress Notes (Signed)
PT HYDROTHERAPY PROGRESS NOTE   04/15/12 1000  Subjective Assessment  Subjective Pt more aroused today but minimally communicative  Patient and Family Stated Goals unable to obtain  Date of Onset (prior to admission)  Prior Treatments unknown  Evaluation and Treatment  Evaluation and Treatment Procedures Explained to Patient/Family (pt non-coherent to receive explanation)  Evaluation and Treatment Procedures Patient unable to consent due to mental status  Pressure Ulcer 04/06/12 Unstageable - Full thickness tissue loss in which the base of the ulcer is covered by slough (yellow, tan, gray, green or brown) and/or eschar (tan, brown or black) in the wound bed. Pink edges with purulent, blackish wound base.   Date First Assessed/Time First Assessed: 04/06/12 2020   Location: Sacrum  Location Orientation: Mid  Staging: Unstageable - Full thickness tissue loss in which the base of the ulcer is covered by slough (yellow, tan, gray, green or brown) and/or eschar   State of Healing Early/partial granulation  Site / Wound Assessment Yellow;Pink;Black  % Wound base Red or Granulating 40%  % Wound base Yellow 60%  % Wound base Black 0%  Peri-wound Assessment Erythema (blanchable)  Margins Unattacted edges (unapproximated)  Drainage Amount Minimal  Drainage Description Odor;Purulent  Treatment Hydrotherapy (Pulse lavage);Debridement (Selective);Packing (Saline gauze)  Dressing Type Gauze (Comment) (moist to dry with Santyl)  Dressing Clean;Dry;Intact  Hydrotherapy  Pulsed Lavage with Suction (psi) 12 psi  Pulsed Lavage with Suction - Normal Saline Used 1000 mL  Pulsed Lavage Tip Tip with splash shield  Pulsed lavage therapy - wound location sacrum  Selective Debridement  Selective Debridement - Location sacrum  Selective Debridement - Tools Used Forceps;Scissors  Selective Debridement - Tissue Removed yellow necrotic tissue  Wound Therapy - Assess/Plan/Recommendations  Wound Therapy -  Clinical Statement Pt wounds are responding well to hydroptherapy treatment.  The black eschar tissue has been removed and there are are increased areas of tissue granulation compared to yesterday.  Pt will continue to benefit from hydrotherapy to facilitate wound healing and debridement to remove unviable tissue.  Will continue to use Santyl as indicated to facilitate further debridement.  Will continue to see 6x/wk as indicated.  Wound Therapy - Functional Problem List decreased skin intergrity  Factors Delaying/Impairing Wound Healing Altered sensation;Diabetes Mellitus;Infection - systemic/local;Vascular compromise;Immobility  Hydrotherapy Plan Debridement;Dressing change;Pulsatile lavage with suction;Patient/family education  Wound Therapy - Frequency 6X / week  Wound Therapy - Follow Up Recommendations Skilled nursing facility  Wound Plan continue as indicated  Wound Therapy Goals - Improve the function of patient's integumentary system by progressing the wound(s) through the phases of wound healing by:  Decrease Necrotic Tissue to < 50%  Decrease Necrotic Tissue - Progress Progressing toward goal  Increase Granulation Tissue to > 50%  Increase Granulation Tissue - Progress Progressing toward goal  Goals/treatment plan/discharge plan were made with and agreed upon by patient/family No, Patient unable to participate in goals/treatment/discharge plan and family unavailable  Time For Goal Achievement 7 days  Wound Therapy - Potential for Goals Good     Charlotte Crumb, PT DPT  [phone]

## 2012-04-18 ENCOUNTER — Telehealth: Payer: Self-pay | Admitting: Gastroenterology

## 2012-04-18 NOTE — Telephone Encounter (Signed)
Pt scheduled for OV with Dr. Arlyce Dice 04/21/12@2 :30pm. [place] to notify pt of appt date and time. Pt scheduled for hospital follow-up.

## 2012-04-21 ENCOUNTER — Ambulatory Visit: Payer: Medicare Other | Admitting: Gastroenterology

## 2012-05-11 ENCOUNTER — Other Ambulatory Visit: Payer: Self-pay | Admitting: *Deleted

## 2012-05-12 ENCOUNTER — Ambulatory Visit (INDEPENDENT_AMBULATORY_CARE_PROVIDER_SITE_OTHER): Payer: Medicare Other | Admitting: Gastroenterology

## 2012-05-12 ENCOUNTER — Encounter: Payer: Self-pay | Admitting: Gastroenterology

## 2012-05-12 VITALS — BP 110/64 | HR 71

## 2012-05-12 DIAGNOSIS — R933 Abnormal findings on diagnostic imaging of other parts of digestive tract: Secondary | ICD-10-CM

## 2012-05-12 NOTE — Assessment & Plan Note (Signed)
CT findings were consistent with a colitis, presumably due to 2 an infection. There is no evidence for pseudomembranous colitis. As far as I can tell he's had no sequela. He completed a course of Cipro and Flagyl.  Medications #1 no further GI follow

## 2012-05-12 NOTE — Progress Notes (Signed)
History of Present Illness: This 78 year old white male seen in the hospital in early November for altered mental status. Abdominal CT  demonstrated colitic changes in the colon. He was treated with Cipro and Flagyl empirically. He tested negative for C. difficile toxin. Other problems during the admission included diabetes  and fever. The patient is demented and cannot provide a history. He does not volunteer any GI complaints.    Past Medical History  Diagnosis Date  . Hypertension   . Diabetes mellitus without complication   . Dementia   . BPH (benign prostatic hyperplasia)    History reviewed. No pertinent past surgical history. family history is not on file. Current Outpatient Prescriptions  Medication Sig Dispense Refill  . aspirin EC 81 MG tablet Take 81 mg by mouth daily.      . Chloroxylenol-Zinc Oxide (BAZA EX) Apply 1 application topically as needed. For incontinence care      . cholecalciferol (VITAMIN D) 1000 UNITS tablet Take 1,000 Units by mouth daily.      . clonazePAM (KLONOPIN) 0.5 MG tablet       . collagenase (SANTYL) ointment Apply topically daily.  15 g    . divalproex (DEPAKOTE ER) 500 MG 24 hr tablet       . docusate sodium (COLACE) 100 MG capsule Take 100 mg by mouth daily.      . EXELON 9.5 MG/24HR       . finasteride (PROSCAR) 5 MG tablet Take 5 mg by mouth daily.      . furosemide (LASIX) 20 MG tablet Take 20 mg by mouth daily.      . insulin aspart (NOVOLOG) 100 UNIT/ML injection Inject 2-10 Units into the skin 3 (three) times daily before meals. Per sliding scale: CBG 100-200= 2 units 201-250= 4 units 251-300= 6 units 301-350= 8 units 351-400= 10 units      . insulin detemir (LEVEMIR) 100 UNIT/ML injection Inject 20 Units into the skin daily. Hold if CBG < 120.  10 mL    . LORazepam (ATIVAN) 2 MG tablet       . meloxicam (MOBIC) 15 MG tablet       . metoprolol (LOPRESSOR) 50 MG tablet Take 0.5 tablets (25 mg total) by mouth 2 (two) times daily.      .  Multiple Vitamin (MULTIVITAMIN WITH MINERALS) TABS Take 1 tablet by mouth daily.      Marland Kitchen nystatin (MYCOSTATIN) powder Apply 1 g topically 2 (two) times daily. Apply to Peri area until healed      . pravastatin (PRAVACHOL) 40 MG tablet Take 40 mg by mouth every evening.       Marland Kitchen PRESCRIPTION MEDICATION Apply 1 mg topically daily as needed. For agitation   Ativan Topical Gel      . QUEtiapine (SEROQUEL) 50 MG tablet       . saccharomyces boulardii (FLORASTOR) 250 MG capsule Take 1 capsule (250 mg total) by mouth 2 (two) times daily.      . Tamsulosin HCl (FLOMAX) 0.4 MG CAPS Take 0.4 mg by mouth every evening.       . temazepam (RESTORIL) 7.5 MG capsule       . traZODone (DESYREL) 50 MG tablet       . vitamin B-12 (CYANOCOBALAMIN) 1000 MCG tablet Take 1,000 mcg by mouth daily.       Allergies as of 05/12/2012  . (No Known Allergies)    reports that he quit smoking about 31 years ago. He does  not have any smokeless tobacco history on file. His alcohol and drug histories not on file.     Review of Systems: He has loss of.. Pertinent positive and negative review of systems were noted in the above HPI section. All other review of systems were otherwise negative.  Vital signs were reviewed in today's medical record Physical Exam: General: Well developed , well nourished, no acute distress Head: Normocephalic and atraumatic Eyes:  sclerae anicteric, EOMI Ears: Normal auditory acuity Mouth: No deformity or lesions Neck: Supple, no masses or thyromegaly Lungs: Clear throughout to auscultation Heart: Regular rate and rhythm; no murmurs, rubs or bruits Abdomen: Soft, non tender and non distended. No masses, hepatosplenomegaly or hernias noted. Normal Bowel sounds Rectal:deferred Musculoskeletal: Symmetrical with no gross deformities  Skin: No lesions on visible extremities Pulses:  Normal pulses noted Extremities: No clubbing, cyanosis, edema or deformities noted Neurological: Alert oriented x  4, grossly nonfocal Cervical Nodes:  No significant cervical adenopathy Inguinal Nodes: No significant inguinal adenopathy Psychological:  Alert and cooperative.

## 2013-03-03 IMAGING — CT CT HEAD W/O CM
1 series · 16 of 30 positions shown, 20 images · non-contrast
Comparison: Head CT scan 03/05/2012.

CLINICAL DATA: Pain.

CT HEAD WITHOUT CONTRAST
TECHNIQUE: Contiguous axial images were obtained from the base of
the skull through the vertex without contrast.

[Series 2: head routine 4.8 h37s · axial · 0.47mm/px · z∈[-101,+32]mm · 16 of 30 slices shown, 20 images]
[im 2/30  brain]
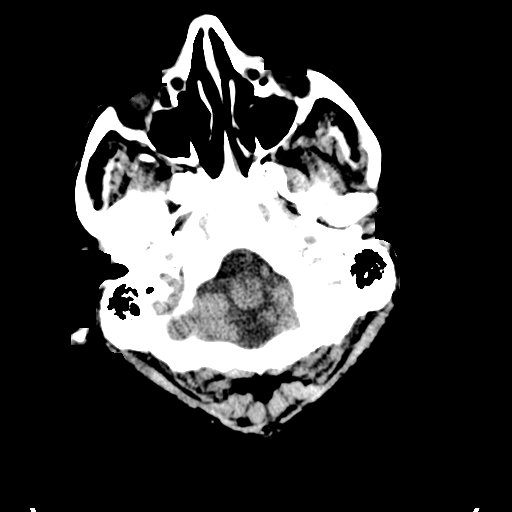
[im 2/30  bone]
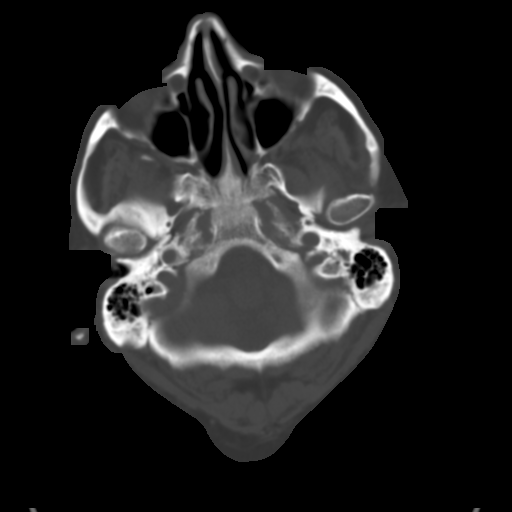
[im 4/30  brain]
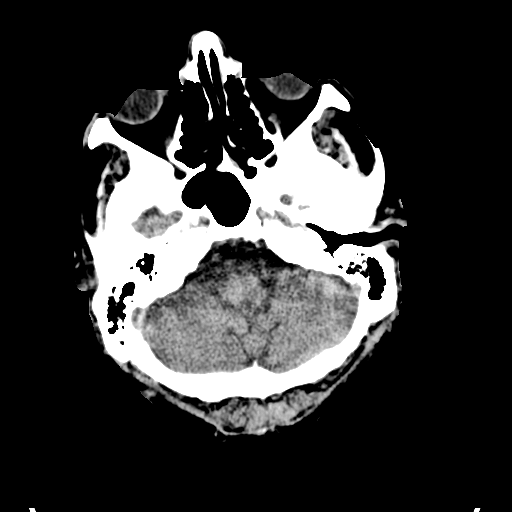
[im 6/30  brain]
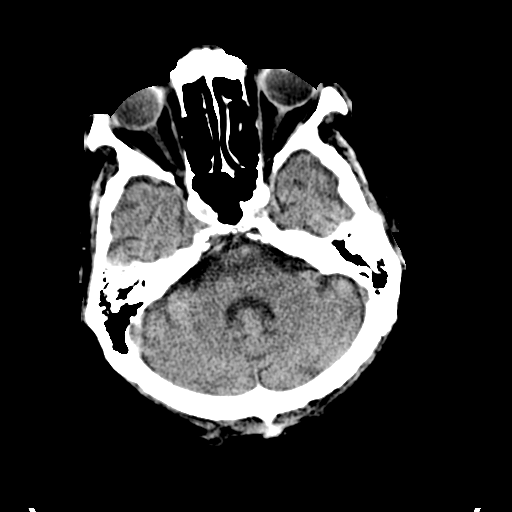
[im 8/30  brain]
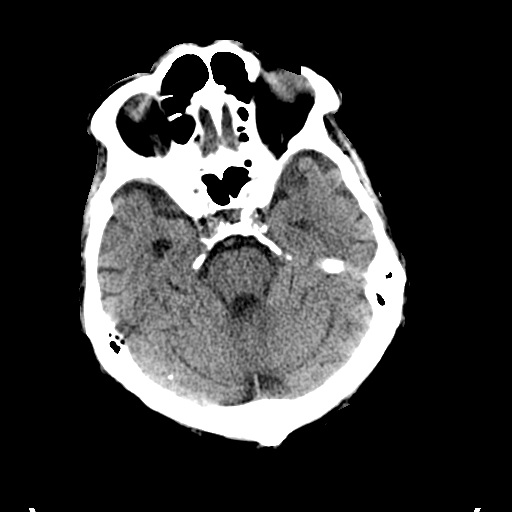
[im 9/30  brain]
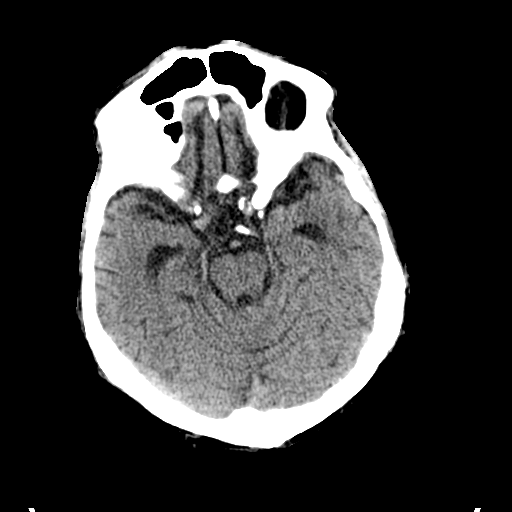
[im 9/30  bone]
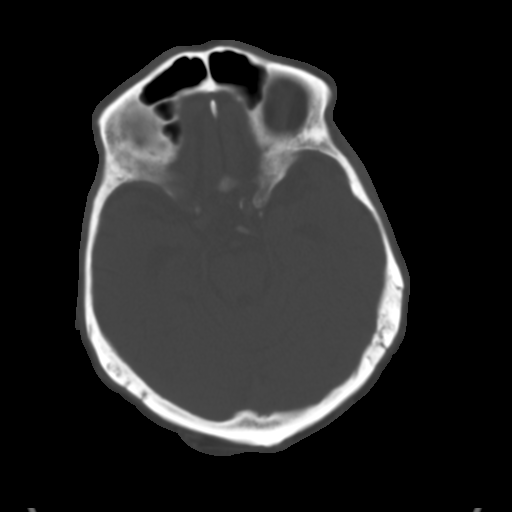
[im 11/30  brain]
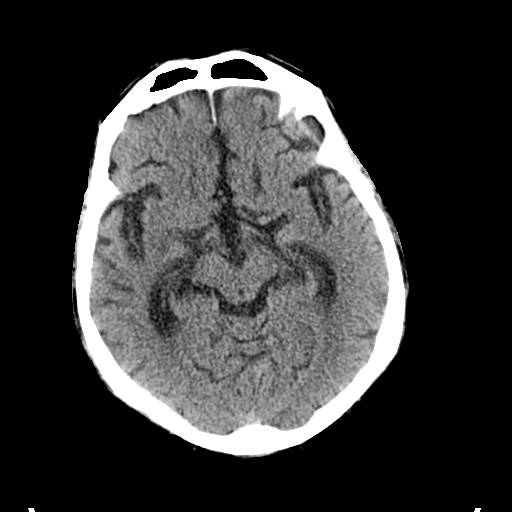
[im 13/30  brain]
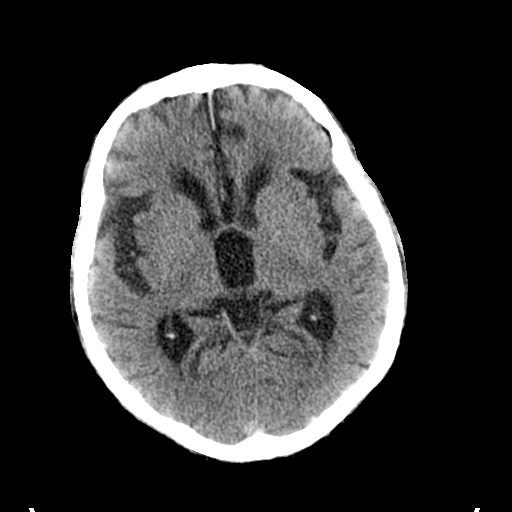
[im 15/30  brain]
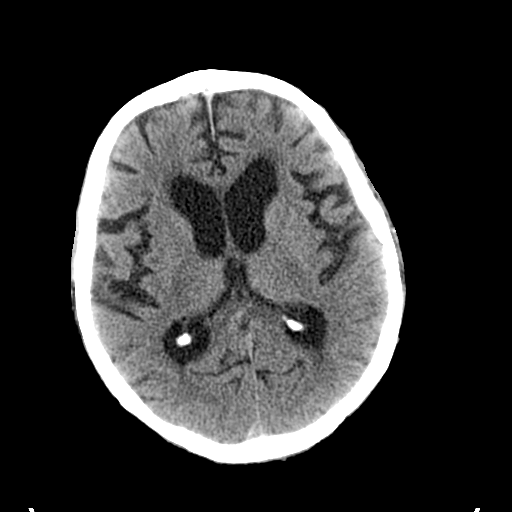
[im 16/30  brain]
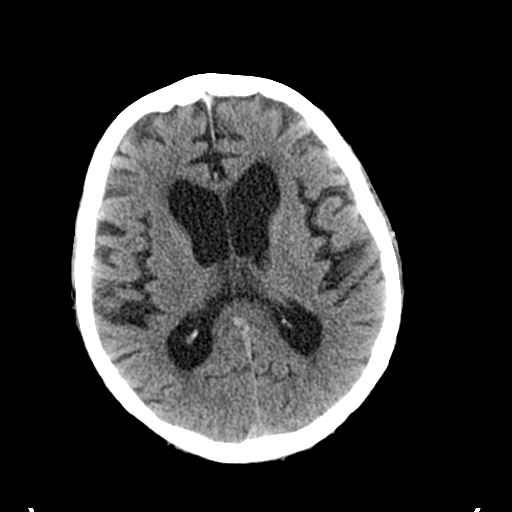
[im 16/30  bone]
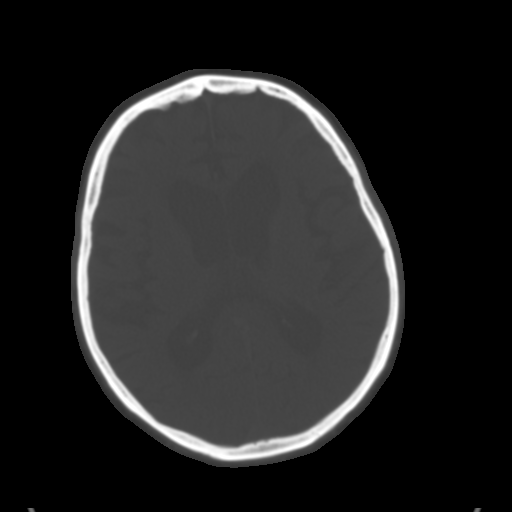
[im 18/30  brain]
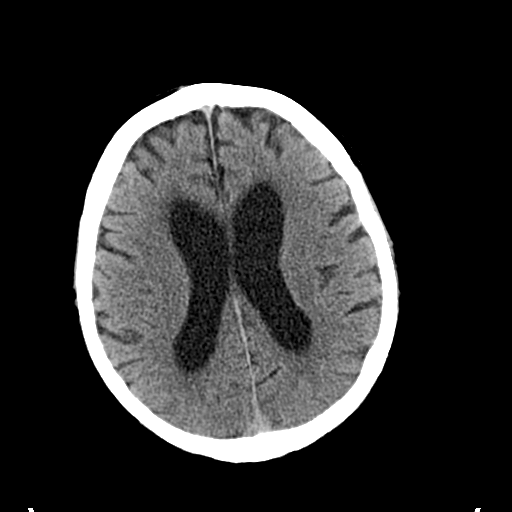
[im 20/30  brain]
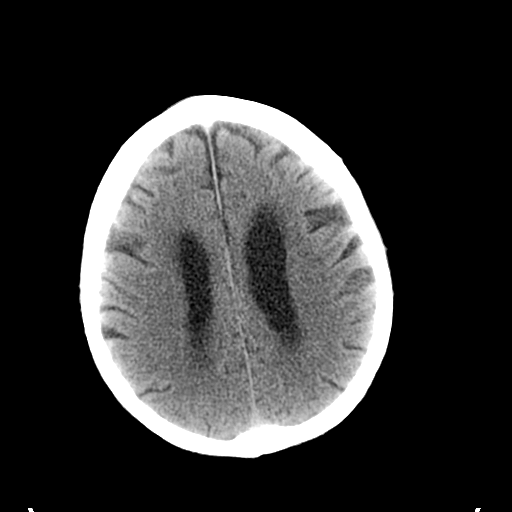
[im 22/30  brain]
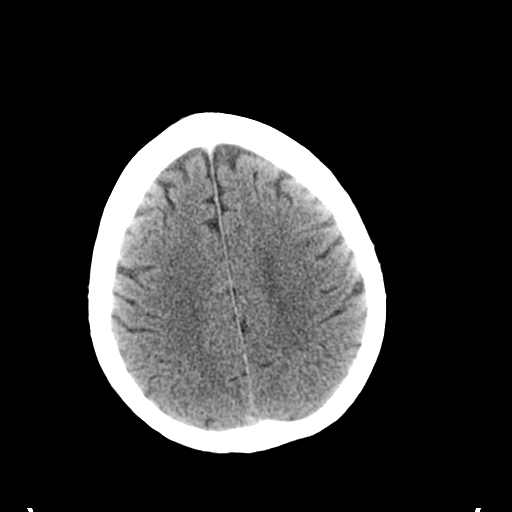
[im 23/30  brain]
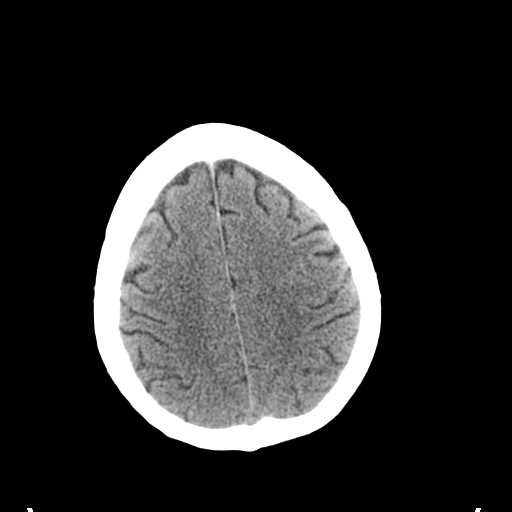
[im 23/30  bone]
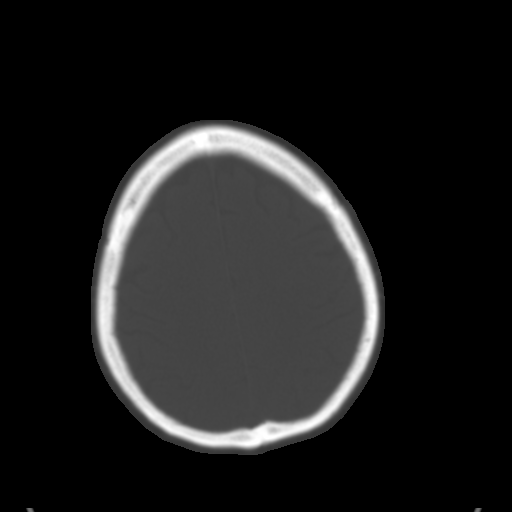
[im 25/30  brain]
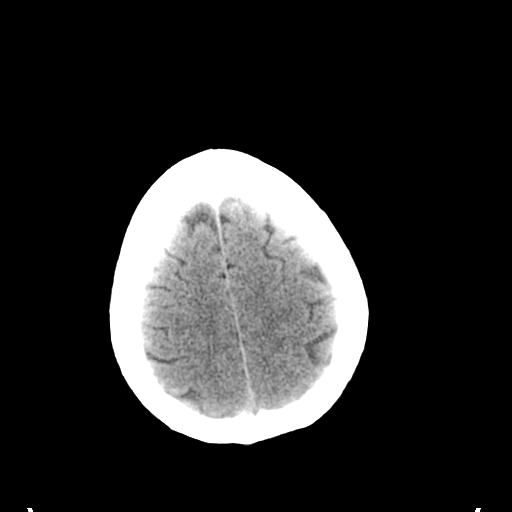
[im 27/30  brain]
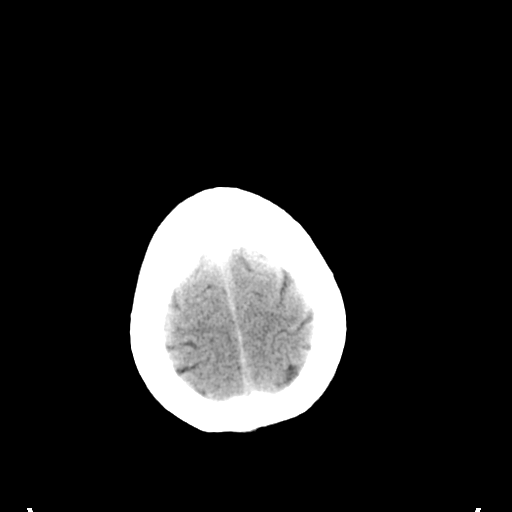
[im 29/30  brain]
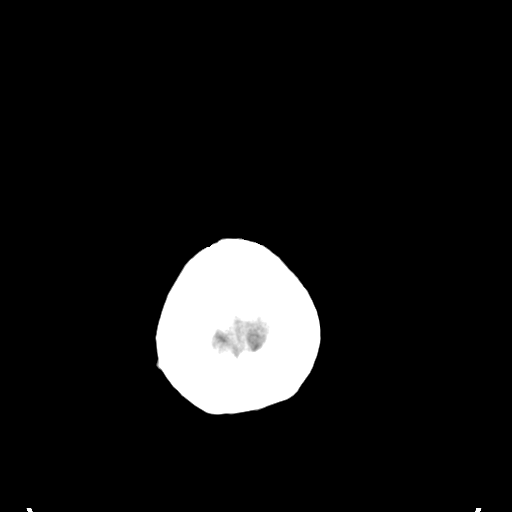

[16 of 30 positions shown; findings below may reference images not displayed]

FINDINGS: There is some chronic microvascular ischemic change and
cortical atrophy.  No evidence of acute abnormality including
infarct, hemorrhage, mass lesion, mass effect, midline shift or
abnormal extra-axial fluid collection.  No hydrocephalus or
pneumocephalus.  The calvarium is intact.  Atherosclerosis is
noted.
IMPRESSION: No acute finding.  Stable compared to prior exam.

## 2013-03-08 IMAGING — CR DG CHEST 1V PORT
1 series · 1 of 1 positions shown · non-contrast
Comparison: None.

CLINICAL DATA: Fever

PORTABLE CHEST - 1 VIEW

[AP]
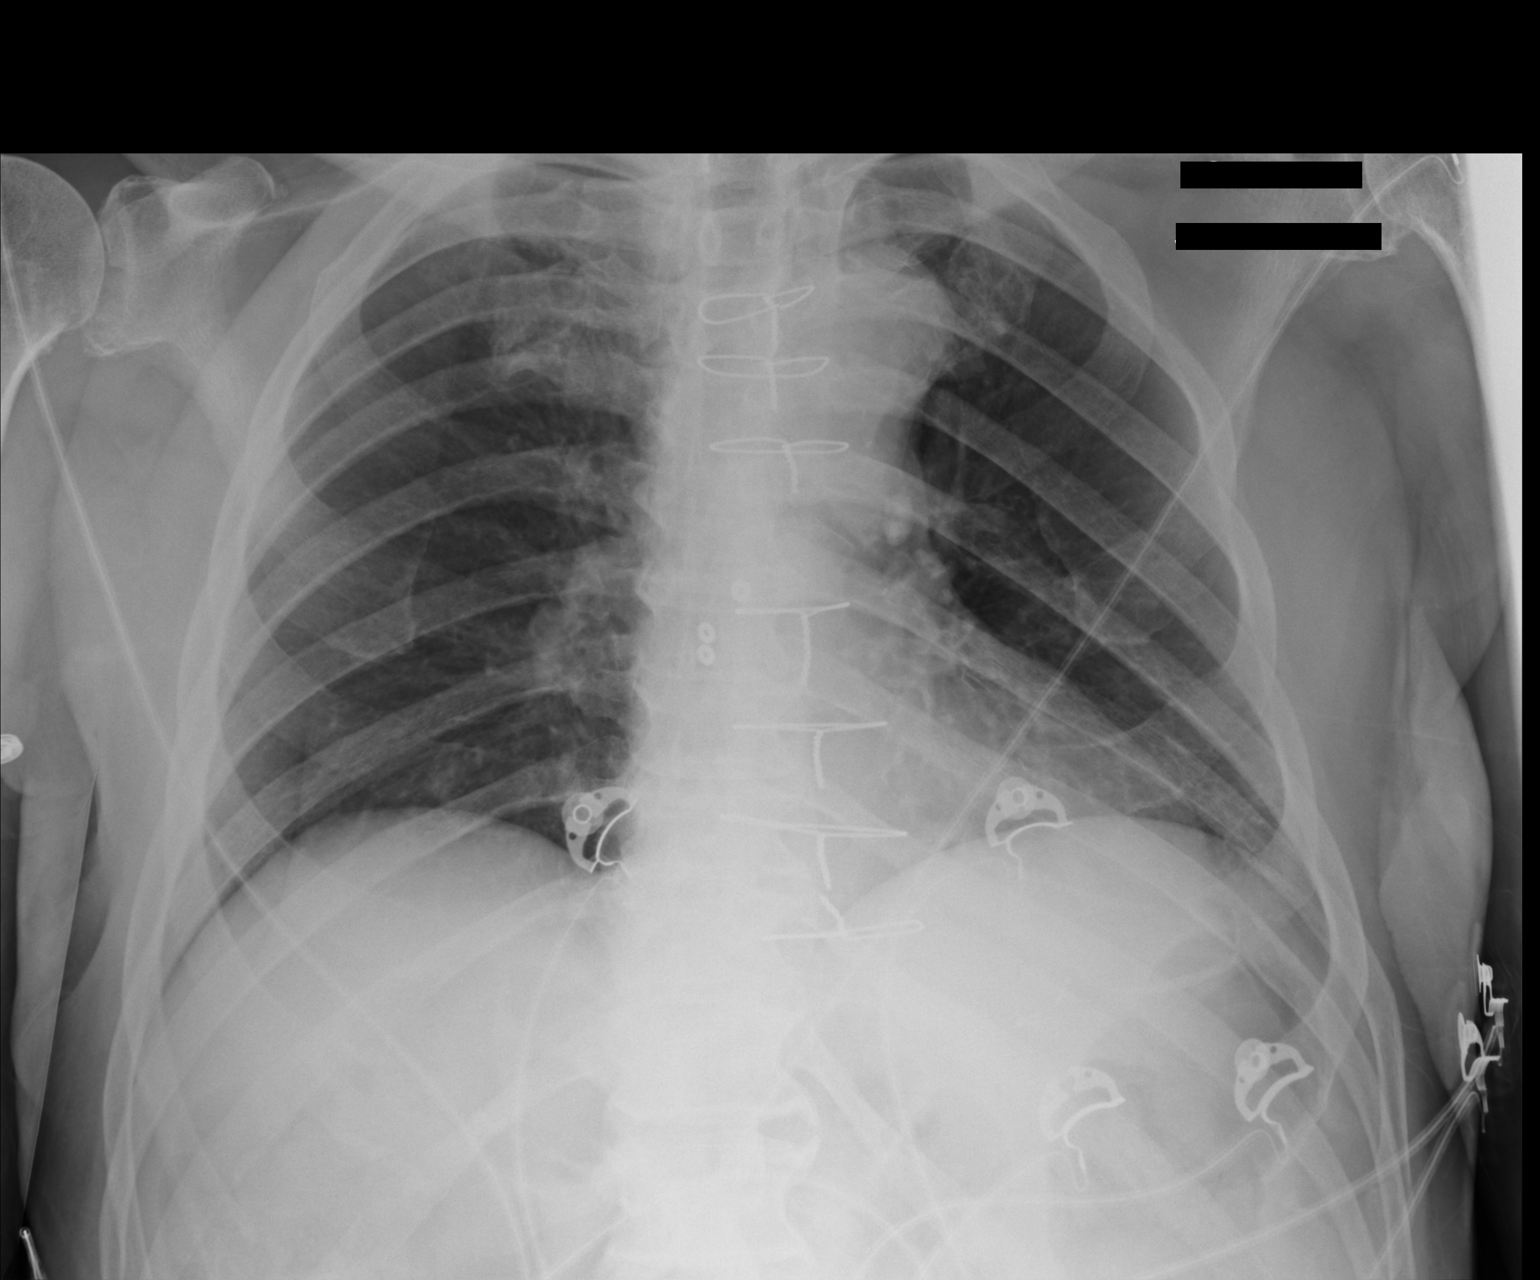

[1 of 1 positions shown; findings below may reference images not displayed]

FINDINGS: Cardiomediastinal silhouette is unremarkable.  Status
post CABG.  No acute infiltrate or pleural effusion.  No pulmonary
edema.  Degenerative changes right AC joint.
IMPRESSION: .  No active disease.  Status post CABG.

## 2013-03-09 IMAGING — CT CT HEAD W/O CM
1 of 2 series · 13 of 30 positions shown, 17 images · non-contrast
Comparison: CT 04/01/2012

CLINICAL DATA: Altered mental status and confusion

CT HEAD WITHOUT CONTRAST
TECHNIQUE: Contiguous axial images were obtained from the base of
the skull through the vertex without contrast.

[Series 2: brain · axial · 0.47mm/px · z∈[+164,+301]mm · 13 of 32 slices shown, 17 images]
[im 3/32  brain]
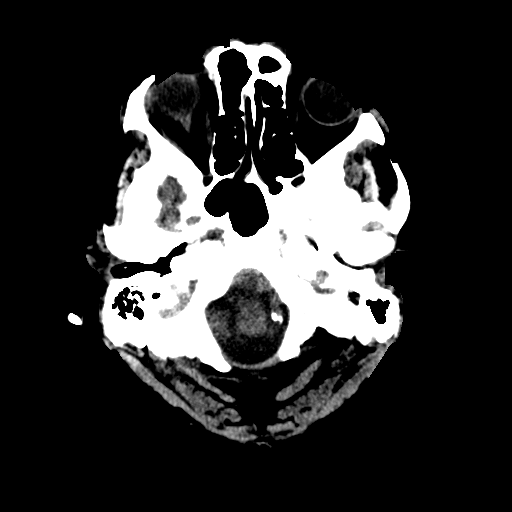
[im 3/32  bone]
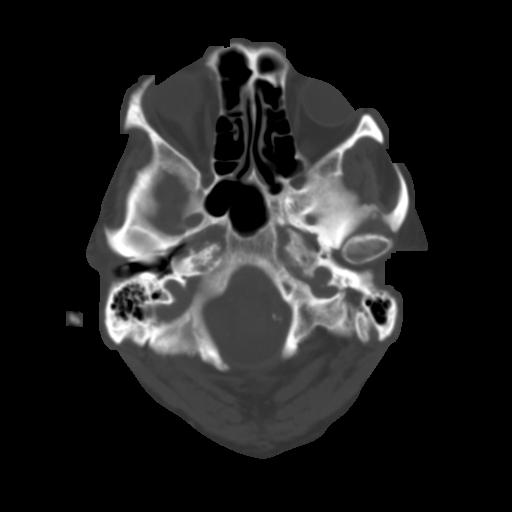
[im 5/32  brain]
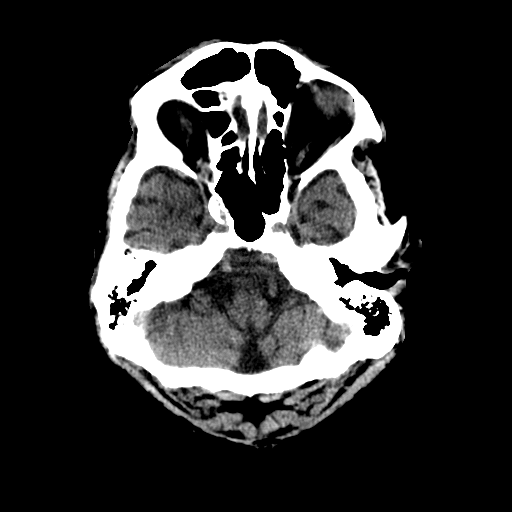
[im 7/32  brain]
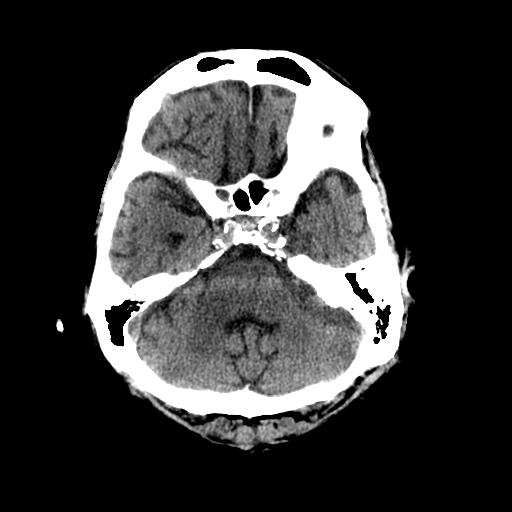
[im 9/32  brain]
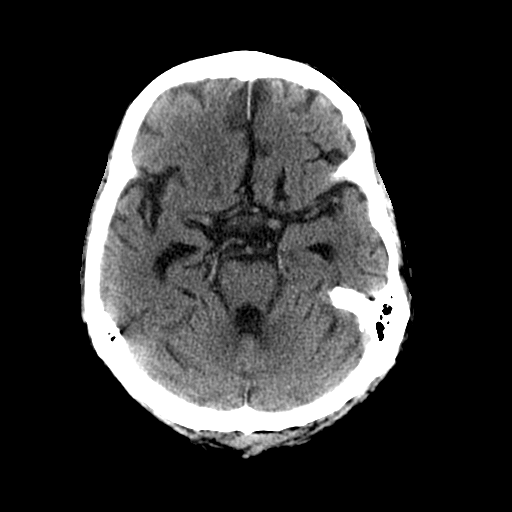
[im 12/32  brain]
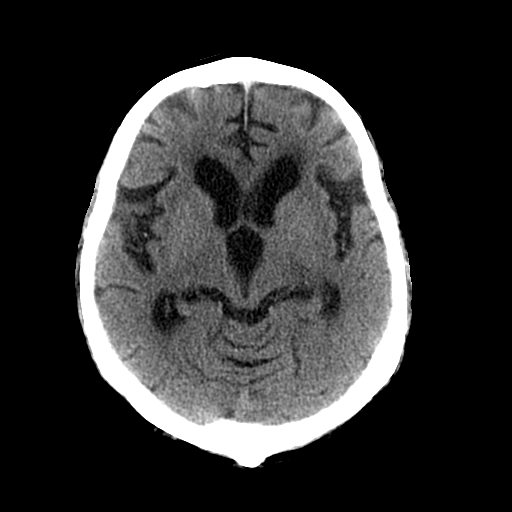
[im 12/32  bone]
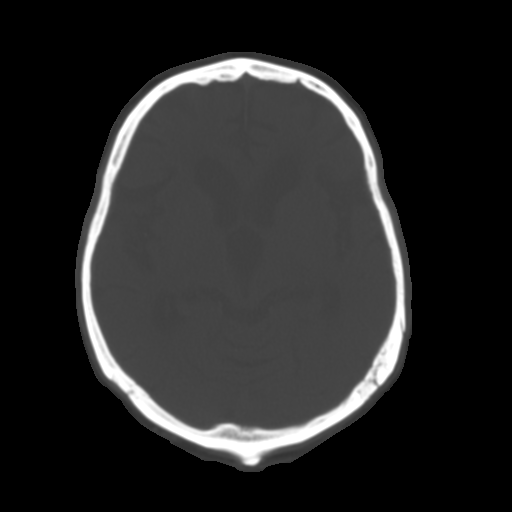
[im 14/32  brain]
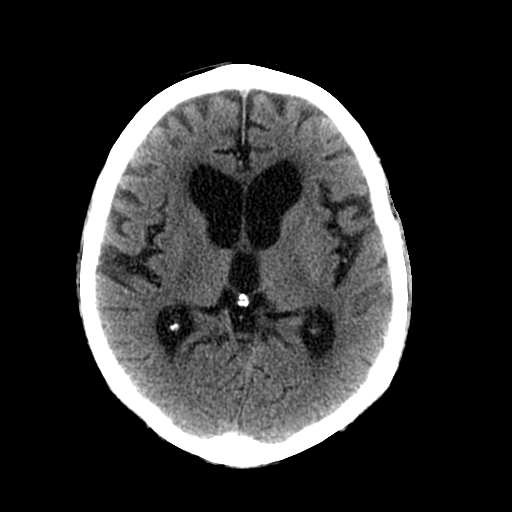
[im 16/32  brain]
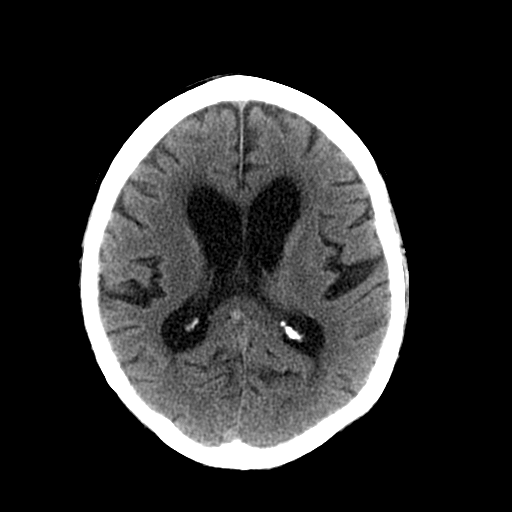
[im 18/32  brain]
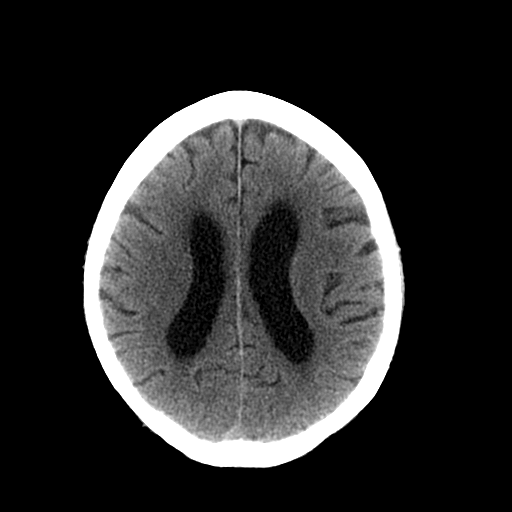
[im 20/32  brain]
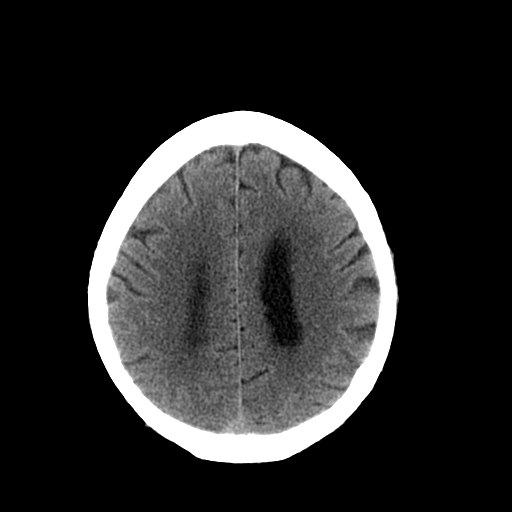
[im 20/32  bone]
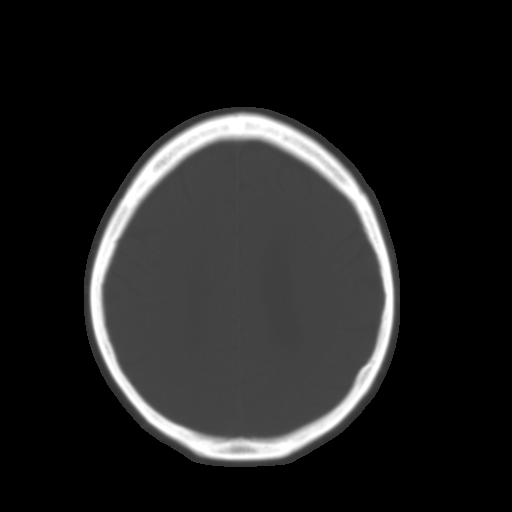
[im 23/32  brain]
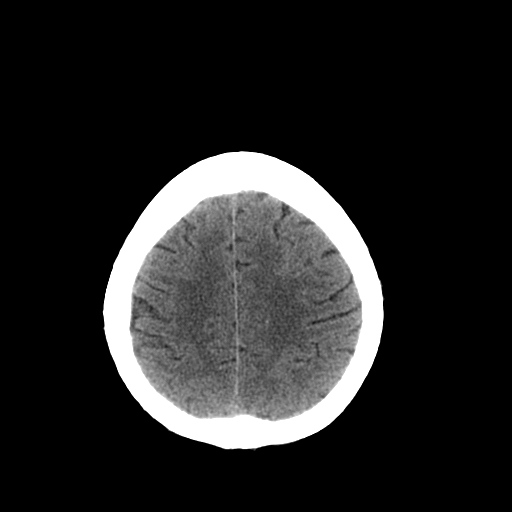
[im 25/32  brain]
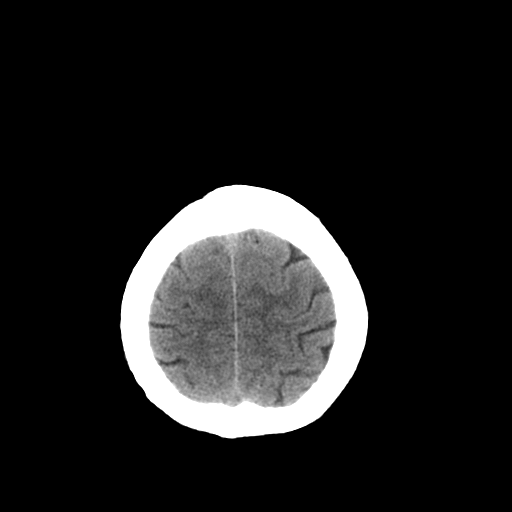
[im 27/32  brain]
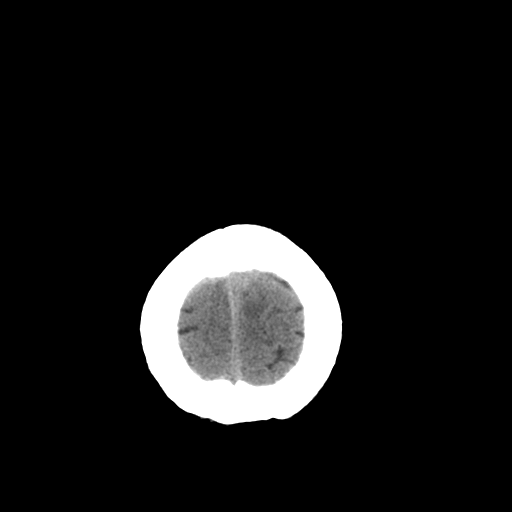
[im 29/32  brain]
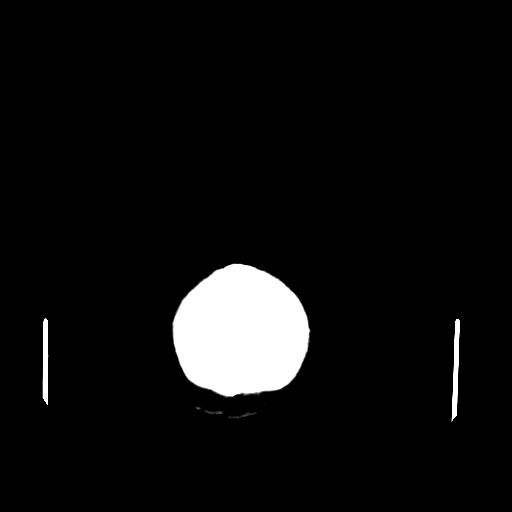
[im 29/32  bone]
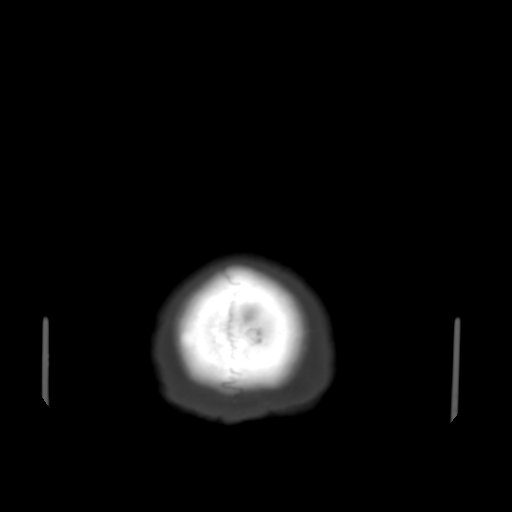

[13 of 30 positions shown; findings below may reference images not displayed]

FINDINGS: Moderately severe atrophy.  Diffuse ventricular
enlargement is unchanged and most compatible with central atrophy.

No acute infarct.  Negative for hemorrhage or mass lesion.  No
fluid collection.  Extensive carotid atherosclerotic calcification.
Bilateral vertebral calcification.  No skull lesion.
IMPRESSION: Ventricular enlargement, compatible with atrophy.  No acute
abnormality and no interval change.

## 2013-05-17 ENCOUNTER — Emergency Department (HOSPITAL_COMMUNITY): Payer: PRIVATE HEALTH INSURANCE

## 2013-05-17 ENCOUNTER — Encounter (HOSPITAL_COMMUNITY): Payer: Self-pay | Admitting: Emergency Medicine

## 2013-05-17 ENCOUNTER — Emergency Department (HOSPITAL_COMMUNITY)
Admission: EM | Admit: 2013-05-17 | Discharge: 2013-05-17 | Disposition: A | Discharge status: Home / Self Care | Payer: PRIVATE HEALTH INSURANCE | Source: Home / Self Care | Attending: Emergency Medicine | Admitting: Emergency Medicine

## 2013-05-17 ENCOUNTER — Emergency Department (HOSPITAL_COMMUNITY)
Admission: EM | Admit: 2013-05-17 | Discharge: 2013-05-19 | Disposition: A | Discharge status: Home / Self Care | Payer: PRIVATE HEALTH INSURANCE | Attending: Emergency Medicine | Admitting: Emergency Medicine

## 2013-05-17 DIAGNOSIS — I10 Essential (primary) hypertension: Secondary | ICD-10-CM | POA: Insufficient documentation

## 2013-05-17 DIAGNOSIS — F0391 Unspecified dementia with behavioral disturbance: Secondary | ICD-10-CM | POA: Insufficient documentation

## 2013-05-17 DIAGNOSIS — Z794 Long term (current) use of insulin: Secondary | ICD-10-CM | POA: Insufficient documentation

## 2013-05-17 DIAGNOSIS — F03918 Unspecified dementia, unspecified severity, with other behavioral disturbance: Secondary | ICD-10-CM | POA: Insufficient documentation

## 2013-05-17 DIAGNOSIS — Y9339 Activity, other involving climbing, rappelling and jumping off: Secondary | ICD-10-CM | POA: Insufficient documentation

## 2013-05-17 DIAGNOSIS — N400 Enlarged prostate without lower urinary tract symptoms: Secondary | ICD-10-CM | POA: Insufficient documentation

## 2013-05-17 DIAGNOSIS — Z87891 Personal history of nicotine dependence: Secondary | ICD-10-CM | POA: Insufficient documentation

## 2013-05-17 DIAGNOSIS — Z7982 Long term (current) use of aspirin: Secondary | ICD-10-CM | POA: Insufficient documentation

## 2013-05-17 DIAGNOSIS — R4689 Other symptoms and signs involving appearance and behavior: Secondary | ICD-10-CM

## 2013-05-17 DIAGNOSIS — F911 Conduct disorder, childhood-onset type: Secondary | ICD-10-CM | POA: Insufficient documentation

## 2013-05-17 DIAGNOSIS — F4310 Post-traumatic stress disorder, unspecified: Secondary | ICD-10-CM | POA: Insufficient documentation

## 2013-05-17 DIAGNOSIS — Z79899 Other long term (current) drug therapy: Secondary | ICD-10-CM | POA: Insufficient documentation

## 2013-05-17 DIAGNOSIS — W1789XA Other fall from one level to another, initial encounter: Secondary | ICD-10-CM | POA: Insufficient documentation

## 2013-05-17 DIAGNOSIS — G309 Alzheimer's disease, unspecified: Secondary | ICD-10-CM | POA: Insufficient documentation

## 2013-05-17 DIAGNOSIS — E119 Type 2 diabetes mellitus without complications: Secondary | ICD-10-CM | POA: Insufficient documentation

## 2013-05-17 DIAGNOSIS — F0390 Unspecified dementia without behavioral disturbance: Secondary | ICD-10-CM

## 2013-05-17 DIAGNOSIS — R739 Hyperglycemia, unspecified: Secondary | ICD-10-CM

## 2013-05-17 DIAGNOSIS — F0280 Dementia in other diseases classified elsewhere without behavioral disturbance: Secondary | ICD-10-CM | POA: Insufficient documentation

## 2013-05-17 DIAGNOSIS — R4182 Altered mental status, unspecified: Secondary | ICD-10-CM

## 2013-05-17 DIAGNOSIS — Y9210 Unspecified residential institution as the place of occurrence of the external cause: Secondary | ICD-10-CM | POA: Insufficient documentation

## 2013-05-17 LAB — CBC
HCT: 39.8 % (ref 39.0–52.0)
Hemoglobin: 14.1 g/dL (ref 13.0–17.0)
RBC: 4.5 MIL/uL (ref 4.22–5.81)
WBC: 6.9 10*3/uL (ref 4.0–10.5)

## 2013-05-17 LAB — COMPREHENSIVE METABOLIC PANEL
ALT: 17 U/L (ref 0–53)
Alkaline Phosphatase: 80 U/L (ref 39–117)
BUN: 18 mg/dL (ref 6–23)
CO2: 26 mEq/L (ref 19–32)
GFR calc Af Amer: >90 mL/min (ref >90)
GFR calc non Af Amer: 80 mL/min — ABNORMAL LOW (ref >90)
Glucose, Bld: 524 mg/dL — ABNORMAL HIGH (ref 70–99)
Potassium: 4.8 mEq/L (ref 3.5–5.1)
Sodium: 131 mEq/L — ABNORMAL LOW (ref 135–145)
Total Bilirubin: 0.3 mg/dL (ref 0.3–1.2)

## 2013-05-17 LAB — GLUCOSE, CAPILLARY
Glucose-Capillary: 448 mg/dL — ABNORMAL HIGH (ref 70–99)
Glucose-Capillary: 342 mg/dL — ABNORMAL HIGH (ref 70–99)
Glucose-Capillary: 263 mg/dL — ABNORMAL HIGH (ref 70–99)
Glucose-Capillary: 273 mg/dL — ABNORMAL HIGH (ref 70–99)

## 2013-05-17 LAB — POCT I-STAT, CHEM 8
Calcium, Ion: 1.24 mmol/L (ref 1.13–1.30)
Creatinine, Ser: 1 mg/dL (ref 0.50–1.35)
Glucose, Bld: 521 mg/dL — ABNORMAL HIGH (ref 70–99)
HCT: 43 % (ref 39.0–52.0)
Hemoglobin: 14.6 g/dL (ref 13.0–17.0)
Potassium: 4.8 mEq/L (ref 3.5–5.1)

## 2013-05-17 LAB — URINALYSIS, ROUTINE W REFLEX MICROSCOPIC
Bilirubin Urine: NEGATIVE
Hgb urine dipstick: NEGATIVE
Ketones, ur: NEGATIVE mg/dL
Leukocytes, UA: NEGATIVE
Protein, ur: NEGATIVE mg/dL
Specific Gravity, Urine: 1.03 (ref 1.005–1.030)
Urobilinogen, UA: 0.2 mg/dL (ref 0.0–1.0)
pH: 6 (ref 5.0–8.0)

## 2013-05-17 MED ORDER — DEXTROSE 50 % IV SOLN: 25.0000 mL | INTRAVENOUS | Status: DC | PRN

## 2013-05-17 MED ORDER — METOPROLOL SUCCINATE ER 25 MG PO TB24
25.0000 mg | ORAL_TABLET | Freq: Two times a day (BID) | ORAL | Status: DC
  Administered 2013-05-17 – 2013-05-18 (×2): 25 mg via ORAL
  Filled 2013-05-17 (×5): qty 1

## 2013-05-17 MED ORDER — METFORMIN HCL 500 MG PO TABS
1000.0000 mg | ORAL_TABLET | Freq: Two times a day (BID) | ORAL | Status: DC
  Administered 2013-05-17 – 2013-05-19 (×3): 1000 mg via ORAL
  Filled 2013-05-17 (×7): qty 2

## 2013-05-17 MED ORDER — ZOLPIDEM TARTRATE 10 MG PO TABS: 10.0000 mg | ORAL_TABLET | Freq: Every evening | ORAL | Status: DC | PRN

## 2013-05-17 MED ORDER — CLOTRIMAZOLE 1 % EX CREA
1.0000 | TOPICAL_CREAM | Freq: Two times a day (BID) | CUTANEOUS | Status: DC
Start: 2013-05-17 — End: 2013-05-19
  Filled 2013-05-17: qty 15

## 2013-05-17 MED ORDER — QUETIAPINE FUMARATE 100 MG PO TABS
100.0000 mg | ORAL_TABLET | Freq: Every day | ORAL | Status: DC
  Administered 2013-05-17: 100 mg via ORAL
  Filled 2013-05-17: qty 1

## 2013-05-17 MED ORDER — ASPIRIN EC 81 MG PO TBEC
81.0000 mg | DELAYED_RELEASE_TABLET | Freq: Every day | ORAL | Status: DC
  Administered 2013-05-17 – 2013-05-19 (×3): 81 mg via ORAL
  Filled 2013-05-17 (×4): qty 1

## 2013-05-17 MED ORDER — VITAMIN D3 25 MCG (1000 UNIT) PO TABS
1000.0000 [IU] | ORAL_TABLET | Freq: Every day | ORAL | Status: DC
  Administered 2013-05-17 – 2013-05-19 (×3): 1000 [IU] via ORAL
  Filled 2013-05-17 (×3): qty 1

## 2013-05-17 MED ORDER — QUETIAPINE FUMARATE 50 MG PO TABS
50.0000 mg | ORAL_TABLET | Freq: Every day | ORAL | Status: DC
  Administered 2013-05-17: 50 mg via ORAL
  Filled 2013-05-17: qty 1

## 2013-05-17 MED ORDER — SACCHAROMYCES BOULARDII 250 MG PO CAPS
250.0000 mg | ORAL_CAPSULE | Freq: Two times a day (BID) | ORAL | Status: DC
  Administered 2013-05-17 – 2013-05-18 (×2): 250 mg via ORAL
  Filled 2013-05-17 (×5): qty 1

## 2013-05-17 MED ORDER — INSULIN DETEMIR 100 UNIT/ML ~~LOC~~ SOLN
20.0000 [IU] | Freq: Every day | SUBCUTANEOUS | Status: DC
  Administered 2013-05-17 – 2013-05-19 (×3): 20 [IU] via SUBCUTANEOUS
  Filled 2013-05-17 (×3): qty 0.2

## 2013-05-17 MED ORDER — POLYETHYLENE GLYCOL 3350 17 G PO PACK
17.0000 g | PACK | Freq: Every day | ORAL | Status: DC
  Administered 2013-05-17: 17 g via ORAL
  Filled 2013-05-17 (×3): qty 1

## 2013-05-17 MED ORDER — VITAMIN C 500 MG PO TABS
500.0000 mg | ORAL_TABLET | Freq: Two times a day (BID) | ORAL | Status: DC
  Administered 2013-05-17 – 2013-05-18 (×2): 500 mg via ORAL
  Filled 2013-05-17 (×5): qty 1

## 2013-05-17 MED ORDER — ONDANSETRON HCL 4 MG PO TABS: 4.0000 mg | ORAL_TABLET | Freq: Three times a day (TID) | ORAL | Status: DC | PRN

## 2013-05-17 MED ORDER — SODIUM CHLORIDE 0.9 % IV BOLUS (SEPSIS)
1000.0000 mL | Freq: Once | INTRAVENOUS | Status: AC
  Administered 2013-05-17: 1000 mL via INTRAVENOUS

## 2013-05-17 MED ORDER — FUROSEMIDE 20 MG PO TABS
20.0000 mg | ORAL_TABLET | Freq: Every day | ORAL | Status: DC
  Administered 2013-05-17 – 2013-05-19 (×3): 20 mg via ORAL
  Filled 2013-05-17 (×3): qty 1

## 2013-05-17 MED ORDER — SODIUM CHLORIDE 0.9 % IV SOLN
INTRAVENOUS | Status: DC
  Administered 2013-05-17: 3.9 [IU]/h via INTRAVENOUS
  Filled 2013-05-17: qty 1

## 2013-05-17 MED ORDER — ALUM & MAG HYDROXIDE-SIMETH 200-200-20 MG/5ML PO SUSP: 30.0000 mL | ORAL | Status: DC | PRN

## 2013-05-17 MED ORDER — ACETAMINOPHEN 325 MG PO TABS: 650.0000 mg | ORAL_TABLET | ORAL | Status: DC | PRN

## 2013-05-17 MED ORDER — TAMSULOSIN HCL 0.4 MG PO CAPS
0.4000 mg | ORAL_CAPSULE | Freq: Every evening | ORAL | Status: DC
  Administered 2013-05-17: 0.4 mg via ORAL
  Filled 2013-05-17: qty 1

## 2013-05-17 MED ORDER — DIVALPROEX SODIUM 500 MG PO DR TAB
500.0000 mg | DELAYED_RELEASE_TABLET | Freq: Two times a day (BID) | ORAL | Status: DC
  Administered 2013-05-17 – 2013-05-18 (×2): 500 mg via ORAL
  Filled 2013-05-17 (×2): qty 1

## 2013-05-17 MED ORDER — ZOLPIDEM TARTRATE 5 MG PO TABS
5.0000 mg | ORAL_TABLET | Freq: Every evening | ORAL | Status: DC | PRN
Start: 2013-05-17 — End: 2013-05-18

## 2013-05-17 MED ORDER — OMEGA-3-ACID ETHYL ESTERS 1 G PO CAPS
1.0000 g | ORAL_CAPSULE | Freq: Every day | ORAL | Status: DC
  Administered 2013-05-17 – 2013-05-19 (×3): 1 g via ORAL
  Filled 2013-05-17 (×3): qty 1

## 2013-05-17 MED ORDER — VITAMIN B-12 1000 MCG PO TABS
1000.0000 ug | ORAL_TABLET | Freq: Every day | ORAL | Status: DC
  Administered 2013-05-17 – 2013-05-19 (×3): 1000 ug via ORAL
  Filled 2013-05-17 (×3): qty 1

## 2013-05-17 MED ORDER — GLIMEPIRIDE 4 MG PO TABS
4.0000 mg | ORAL_TABLET | Freq: Every day | ORAL | Status: DC
  Administered 2013-05-18 – 2013-05-19 (×2): 4 mg via ORAL
  Filled 2013-05-17 (×4): qty 1

## 2013-05-17 MED ORDER — NICOTINE 21 MG/24HR TD PT24
21.0000 mg | MEDICATED_PATCH | Freq: Every day | TRANSDERMAL | Status: DC
  Filled 2013-05-17: qty 1

## 2013-05-17 MED ORDER — INSULIN REGULAR BOLUS VIA INFUSION
0.0000 [IU] | Freq: Three times a day (TID) | INTRAVENOUS | Status: DC
  Filled 2013-05-17: qty 10

## 2013-05-17 MED ORDER — SODIUM CHLORIDE 0.9 % IV SOLN
INTRAVENOUS | Status: DC
  Administered 2013-05-17: 04:00:00 via INTRAVENOUS

## 2013-05-17 MED ORDER — LORAZEPAM 1 MG PO TABS
1.0000 mg | ORAL_TABLET | Freq: Three times a day (TID) | ORAL | Status: DC | PRN
  Administered 2013-05-17: 1 mg via ORAL
  Filled 2013-05-17 (×2): qty 1

## 2013-05-17 MED ORDER — DEXTROSE-NACL 5-0.45 % IV SOLN: INTRAVENOUS | Status: DC

## 2013-05-17 MED ORDER — HYDROCODONE-ACETAMINOPHEN 5-325 MG PO TABS: 1.0000 | ORAL_TABLET | Freq: Four times a day (QID) | ORAL | Status: DC | PRN

## 2013-05-17 MED ORDER — QUETIAPINE FUMARATE 100 MG PO TABS: 200.0000 mg | ORAL_TABLET | Freq: Every day | ORAL | Status: DC

## 2013-05-17 NOTE — ED Notes (Signed)
Pt continues to pace room and outside of doorway.  [employer] officer encourages pt to remain in room.  Pt continues to raise voice making demands to leave

## 2013-05-17 NOTE — ED Notes (Signed)
Pt being held at [facility]. Accepted by [facility] earlier this am.  Pt was seen and treated earlier at [facility]. [facility] stated was unable to keep this pt due to diabetic issues.  Pt returned to [facility] for evaluation. IVC papers present. Pt calm and cooperative with care

## 2013-05-17 NOTE — ED Notes (Signed)
Bed: UJ81 Expected date: 05/17/13 Expected time: 1:32 AM Means of arrival: Ambulance Comments: 79 yo M  fall

## 2013-05-17 NOTE — BH Assessment (Signed)
Assessment Note  Jonathon Lane is an 79 y.o. male who presents to the ED again, after refusing to return to his skilled nursing facility. Per discussion with facility, patient refused to walk into the building and began ot walk away form the facility, was combative with staff, and swung at a police officer when trying to ensure patient safety. Per IVC patient was wandering into traffic. Per family and facility patient has been refusing medications and care including patient insulin. Pt denies SI/HI/Ah/VH.  Patient has been deemed incompetent, and patient spouse is guardian for patient as well as HCPOA. Pt currently a resident at a skilled nursing facility and planning to move to [facility] in [place] in January once the facility opens with can provide memory care and a locked unit.    Axis I: Dementia NOS  Axis II: Deferred Axis III:  Past Medical History  Diagnosis Date  . Hypertension   . Diabetes mellitus without complication   . Dementia   . BPH (benign prostatic hyperplasia)    Axis IV: housing problems, other psychosocial or environmental problems, problems related to social environment and problems with primary support group Axis V: 11-20 some danger of hurting self or others possible OR occasionally fails to maintain minimal personal hygiene OR gross impairment in communication  Past Medical History:  Past Medical History  Diagnosis Date  . Hypertension   . Diabetes mellitus without complication   . Dementia   . BPH (benign prostatic hyperplasia)     History reviewed. No pertinent past surgical history.  Family History: No family history on file.  Social History:  reports that he quit smoking about 32 years ago. He does not have any smokeless tobacco history on file. His alcohol and drug histories are not on file.  Additional Social History:     CIWA: CIWA-Ar BP: 115/68 mmHg Pulse Rate: 68 COWS:    Allergies: No Known Allergies  Home Medications:  (Not in a  hospital admission)  OB/GYN Status:  No LMP for male patient.  General Assessment Data Location of Assessment: [facility] ED Is this a Tele or Face-to-Face Assessment?: Face-to-Face Is this an Initial Assessment or a Re-assessment for this encounter?: Initial Assessment Living Arrangements: Other (Comment) ([facility] ) Can pt return to current living arrangement?: No Admission Status: Involuntary Is patient capable of signing voluntary admission?: No Transfer from: Home Referral Source: Self/Family/Friend     [facility] Crisis Care Plan Living Arrangements: Other (Comment) ([facility] ) Name of Psychiatrist: no Name of Therapist: no  Education Status Is patient currently in school?: No  Risk to self Suicidal Ideation: No Suicidal Intent: No Is patient at risk for suicide?: No Suicidal Plan?: No Access to Means: No What has been your use of drugs/alcohol within the last 12 months?: none Previous Attempts/Gestures: No How many times?: 0 Other Self Harm Risks: no Triggers for Past Attempts: None known Intentional Self Injurious Behavior: None Family Suicide History: No Recent stressful life event(s): Other (Comment) (worsening dementia ) Persecutory voices/beliefs?: No Depression: No Substance abuse history and/or treatment for substance abuse?: No  Risk to Others Homicidal Ideation: No Thoughts of Harm to Others: No Current Homicidal Intent: No Current Homicidal Plan: No Access to Homicidal Means: No Identified Victim: n/a  History of harm to others?: Yes Assessment of Violence: On admission Violent Behavior Description: combative towrds nursing home staff and gpd  Does patient have access to weapons?: No Criminal Charges Pending?: No Does patient have a court date:  No  Psychosis Hallucinations: None noted Delusions: None noted  Mental Status Report Appear/Hygiene: Disheveled Eye Contact: Good Motor Activity: Freedom of movement Speech:  Argumentative Level of Consciousness: Alert;Irritable Mood: Irritable Affect: Appropriate to circumstance Anxiety Level: Minimal Thought Processes: Circumstantial;Tangential Judgement: Impaired Orientation: Person;Place;Time;Situation  Cognitive Functioning Concentration: Normal Memory: Recent Impaired;Remote Impaired IQ: Average Insight: Poor Impulse Control: Poor Appetite: Good Sleep: No Change Vegetative Symptoms: None  ADLScreening [facility] Assessment Services) Patient's cognitive ability adequate to safely complete daily activities?: No Patient able to express need for assistance with ADLs?: Yes Independently performs ADLs?: No  Prior Inpatient Therapy Prior Inpatient Therapy: Yes Prior Therapy Facilty/Provider(s): [facility], [facility] Reason for Treatment: Dementia  Prior Outpatient Therapy Prior Outpatient Therapy: No  ADL Screening (condition at time of admission) Patient's cognitive ability adequate to safely complete daily activities?: No Patient able to express need for assistance with ADLs?: Yes Independently performs ADLs?: No         Values / Beliefs Cultural Requests During Hospitalization: None Spiritual Requests During Hospitalization: None        Additional Information 1:1 In Past 12 Months?: No CIRT Risk: No Elopement Risk: Yes Does patient have medical clearance?: No     Disposition:  Disposition Initial Assessment Completed for this Encounter: Yes Disposition of Patient: Inpatient treatment program  On Site Evaluation by:   Reviewed with Physician:    Catha Gosselin A 05/17/2013 3:55 PM

## 2013-05-17 NOTE — ED Notes (Signed)
[facility] truck 21 talked with pt and encouraged him to leave with them, they transported pt back to [facility].

## 2013-05-17 NOTE — Progress Notes (Addendum)
Pt presented again to the ED after increased agitation and combativeness. Per discussion with snf, patient combative with staff and police officers when they were trying to get patient back in to the facility after returning from the hosptial this morning. CSW unable to reach patient spouse at home. CSW spoke with pt daughter, who stated that patient has been refusing medication including insulin, and haldol. Pt daughter provided CSW with pt spouse cell phone number [phone]. Per pt daughter, pt spouse also guardian. CSW called pt spouse cell phone however had to leave a message.   Catha Gosselin, LCSW [phone]  ED CSW .05/17/2013 1403pm \  CSW spoke with ED charge RN regarding patient insulin, and earlier levels. RN stated she would address those needs. CSW aadvised charge nurse that patient may be appropriate for [place] due to refusing of medications and care.   Catha Gosselin, [place] [phone]  ED CSW 05/17/2013 1412pm

## 2013-05-17 NOTE — ED Provider Notes (Signed)
[facility] has refused to transport patient. Patient has some dementia, but is refusing to go back to his nursing home. Unsure if they can manage him. Will get social work involved.   Juliet Rude. Rubin Payor, MD 05/17/13 [phone]

## 2013-05-17 NOTE — ED Notes (Signed)
Family contact: [phone] daughter Waynetta Sandy cell #.

## 2013-05-17 NOTE — ED Provider Notes (Signed)
CSN: [id number]     Arrival date & time 05/17/13  1332 History   First MD Initiated Contact with Patient 05/17/13 1500     Chief Complaint  Patient presents with  . Medical Clearance    Level V caveat for dementia  (Consider location/radiation/quality/duration/timing/severity/associated sxs/prior Treatment) HPI  Patient was transported to the emergency department last night around 2 AM when he had climbed out the window at his nursing facility and was walking in traffic on the street. He had no trauma, but was found to have a CBG in the 500's which was treated. He then refused to be discharged back to his NH. He was evaluated by social services. He was discharged to [facility] earlier this morning. However while there he refused to take his medications. He was sent back to the emergency department. Patient has dementia and has been getting easily agitated. He also is becoming more threatening. I Patient is angry, stating "for 24 hours I have had people telling me what to do". He states he wanted to leave the facility and "go to the place where people meet, I can't think of what it's called".  PCP Dr Mort Sawyers  Past Medical History  Diagnosis Date  . Hypertension   . Diabetes mellitus without complication   . Dementia   . BPH (benign prostatic hyperplasia)    History reviewed. No pertinent past surgical history. No family history on file. History  Substance Use Topics  . Smoking status: Former Smoker -- 0.50 packs/day    Quit date: 04/08/1981  . Smokeless tobacco: Not on file     Comment: quit 30 years ago  . Alcohol Use: Not on file  pt lives in NH  Review of Systems  All other systems reviewed and are negative.    Allergies  Review of patient's allergies indicates no known allergies.  Home Medications   Current Outpatient Rx  Name  Route  Sig  Dispense  Refill  . ascorbic acid (VITAMIN C) 250 MG CHEW   Oral   Chew 500 mg by mouth 2 (two) times daily.         Marland Kitchen  aspirin EC 81 MG tablet   Oral   Take 81 mg by mouth daily.         . cholecalciferol (VITAMIN D) 1000 UNITS tablet   Oral   Take 1,000 Units by mouth daily.         . clotrimazole (LOTRIMIN) 1 % cream   Topical   Apply 1 application topically 2 (two) times daily. Apply to penis for fungus         . divalproex (DEPAKOTE) 500 MG DR tablet   Oral   Take 500 mg by mouth 2 (two) times daily.         . furosemide (LASIX) 20 MG tablet   Oral   Take 20 mg by mouth daily.         Marland Kitchen glimepiride (AMARYL) 4 MG tablet   Oral   Take 4 mg by mouth daily with breakfast.         . HYDROcodone-acetaminophen (NORCO/VICODIN) 5-325 MG per tablet   Oral   Take 1 tablet by mouth every 6 (six) hours as needed for moderate pain.         Marland Kitchen insulin detemir (LEVEMIR) 100 UNIT/ML injection   Subcutaneous   Inject 20 Units into the skin daily. Hold if CBG < 120.   10 mL      .  metFORMIN (GLUCOPHAGE) 500 MG tablet   Oral   Take 1,000 mg by mouth 2 (two) times daily with a meal.         . metoprolol succinate (TOPROL-XL) 25 MG 24 hr tablet   Oral   Take 25 mg by mouth 2 (two) times daily.         Marland Kitchen omega-3 acid ethyl esters (LOVAZA) 1 G capsule   Oral   Take 1 g by mouth daily.         . polyethylene glycol (MIRALAX / GLYCOLAX) packet   Oral   Take 17 g by mouth daily.         Marland Kitchen PRESCRIPTION MEDICATION   Topical   Apply 1 mg topically daily as needed. For agitation   Ativan Topical Gel         . QUEtiapine (SEROQUEL) 100 MG tablet   Oral   Take 100 mg by mouth daily. Take along with 50mg  tablet to make a total of 150mg  dose         . QUEtiapine (SEROQUEL) 200 MG tablet   Oral   Take 200 mg by mouth at bedtime.         Marland Kitchen QUEtiapine (SEROQUEL) 50 MG tablet   Oral   Take 50 mg by mouth daily. Take along with 100mg  tablet to make a total dose of 150mg          . saccharomyces boulardii (FLORASTOR) 250 MG capsule   Oral   Take 1 capsule (250 mg total) by  mouth 2 (two) times daily.         . Tamsulosin HCl (FLOMAX) 0.4 MG CAPS   Oral   Take 0.4 mg by mouth every evening.          . vitamin B-12 (CYANOCOBALAMIN) 1000 MCG tablet   Oral   Take 1,000 mcg by mouth daily.         Marland Kitchen zolpidem (AMBIEN) 5 MG tablet   Oral   Take 5 mg by mouth at bedtime as needed for sleep.          BP 115/68  Pulse 68  Temp(Src) 98.2 F (36.8 C) (Oral)  SpO2 96%  Vital signs normal   Physical Exam  Nursing note and vitals reviewed. Constitutional: He is oriented to person, place, and time. He appears well-developed and well-nourished.  Non-toxic appearance. He does not appear ill. No distress.  HENT:  Head: Normocephalic and atraumatic.  Right Ear: External ear normal.  Left Ear: External ear normal.  Nose: Nose normal. No mucosal edema or rhinorrhea.  Mouth/Throat: Oropharynx is clear and moist and mucous membranes are normal. No dental abscesses or uvula swelling.  Eyes: Conjunctivae and EOM are normal. Pupils are equal, round, and reactive to light.  Neck: Normal range of motion and full passive range of motion without pain. Neck supple.  Cardiovascular: Normal rate, regular rhythm and normal heart sounds.  Exam reveals no gallop and no friction rub.   No murmur heard. Pulmonary/Chest: Effort normal and breath sounds normal. No respiratory distress. He has no wheezes. He has no rhonchi. He has no rales. He exhibits no tenderness and no crepitus.  Abdominal: Soft. Normal appearance and bowel sounds are normal. He exhibits no distension. There is no tenderness. There is no rebound and no guarding.  Musculoskeletal: Normal range of motion. He exhibits no edema and no tenderness.  Moves all extremities well.   Neurological: He is alert and oriented to person, place,  and time. He has normal strength. No cranial nerve deficit.  Skin: Skin is warm, dry and intact. No rash noted. No erythema. No pallor.  Psychiatric: His speech is normal. His  affect is angry. He is agitated and aggressive. Cognition and memory are impaired. He expresses inappropriate judgment.    ED Course  Procedures (including critical care time)    22:35 Aurther Loft, TSS called to see why the consult was needed. We need quidence on his medications to control his anger and hostility.   Labs Review   Labs Reviewed  GLUCOSE, CAPILLARY - Abnormal; Notable for the following:    Glucose-Capillary 273 (*)    All other components within normal limits    Lab Results  Component Value Date   WBC 6.9 05/17/2013   HGB 14.6 05/17/2013   HCT 43.0 05/17/2013   PLT 149* 05/17/2013   GLUCOSE 521* 05/17/2013   ALT 17 05/17/2013   AST 17 05/17/2013   NA 137 05/17/2013   K 4.8 05/17/2013   CL 98 05/17/2013   CREATININE 1.00 05/17/2013   BUN 19 05/17/2013   CO2 26 05/17/2013   TSH 2.014 04/11/2012   INR 1.48 04/06/2012   HGBA1C 7.3* 04/06/2012      Imaging Review  Ct Head Wo Contrast  05/17/2013   CLINICAL DATA:  Status post fall; concern for head injury.  EXAM: CT HEAD WITHOUT CONTRAST  TECHNIQUE: Contiguous axial images were obtained from the base of the skull through the vertex without intravenous contrast.  COMPARISON:  CT of the head performed 04/07/2012, and MRI of the brain performed 04/12/2012  FINDINGS: There is no evidence of acute infarction, mass lesion, or intra- or extra-axial hemorrhage on CT.  Prominence of the ventricles and sulci reflects mild cortical volume loss. Mild periventricular white matter change likely reflects small vessel ischemic microangiopathy.  The brainstem and fourth ventricle are within normal limits. The basal ganglia are unremarkable in appearance. The cerebral hemispheres demonstrate grossly normal gray-white differentiation. No mass effect or midline shift is seen.  There is no evidence of fracture; visualized osseous structures are unremarkable in appearance. The orbits are within normal limits. The paranasal sinuses and  mastoid air cells are well-aerated. No significant soft tissue abnormalities are seen.  IMPRESSION: 1. No evidence of traumatic intracranial injury or fracture. 2. Mild cortical volume loss and scattered small vessel ischemic microangiopathy.   Electronically Signed   By: Roanna Raider M.D.   On: 05/17/2013 02:39   Dg Chest Portable 1 View  05/17/2013   CLINICAL DATA:  Status post fall; concern for chest injury.  EXAM: PORTABLE CHEST - 1 VIEW  COMPARISON:  Chest radiograph performed 04/07/2012  FINDINGS: The lungs are hypoexpanded. Mild vascular crowding and vascular congestion are seen, without evidence of pulmonary edema. No pleural effusion or pneumothorax is seen.  The cardiomediastinal silhouette is borderline normal in size. The patient is status post median sternotomy, with evidence of prior CABG. No acute osseous abnormalities are seen.  IMPRESSION: Lungs hypoexpanded; mild vascular congestion noted, possibly transient in nature. No displaced rib fractures identified.   Electronically Signed   By: Roanna Raider M.D.   On: 05/17/2013 02:58    EKG Interpretation   None       MDM   1. Aggressive behavior   2. Dementia     Disposition pending   Devoria Albe, MD, Franz Dell, MD 05/17/13 [id number]

## 2013-05-17 NOTE — ED Notes (Signed)
Per EMS: Pt is from [facility]. Has a hx of dementia and alzheimers. Pt was seen trying to climb out window. Staff assisted pt to ground. EMS reports that all limbs move well, no LOC. Pt DNR. CBG 536.

## 2013-05-17 NOTE — ED Provider Notes (Signed)
CSN: [id number]     Arrival date & time 05/17/13  0151 History   First MD Initiated Contact with Patient 05/17/13 0204     Chief Complaint  Patient presents with  . Fall   (Consider location/radiation/quality/duration/timing/severity/associated sxs/prior Treatment) HPI History provided by EMS and the nursing home report. Nursing home tonight was attempting to climb out of a window and staff assisted patient to the ground. Called out for fall, but no reported trauma. Patient found to have a blood sugar of greater than 500. On arrival to emergency department, patient has no complaints. He is jovial, denies any headache, neck pain, chest pain or difficulty breathing. Given dementia and is not a reliable historian and level V caveat applies Past Medical History  Diagnosis Date  . Hypertension   . Diabetes mellitus without complication   . Dementia   . BPH (benign prostatic hyperplasia)    History reviewed. No pertinent past surgical history. History reviewed. No pertinent family history. History  Substance Use Topics  . Smoking status: Former Smoker -- 0.50 packs/day    Quit date: 04/08/1981  . Smokeless tobacco: Not on file     Comment: quit 30 years ago  . Alcohol Use: Not on file    Review of Systems  Unable to perform ROS  level V caveat as above  Allergies  Review of patient's allergies indicates no known allergies.  Home Medications   Current Outpatient Rx  Name  Route  Sig  Dispense  Refill  . ascorbic acid (VITAMIN C) 250 MG CHEW   Oral   Chew 500 mg by mouth 2 (two) times daily.         Marland Kitchen aspirin EC 81 MG tablet   Oral   Take 81 mg by mouth daily.         . cholecalciferol (VITAMIN D) 1000 UNITS tablet   Oral   Take 1,000 Units by mouth daily.         . clotrimazole (LOTRIMIN) 1 % cream   Topical   Apply 1 application topically 2 (two) times daily. Apply to penis for fungus         . divalproex (DEPAKOTE) 500 MG DR tablet   Oral   Take 500 mg by  mouth 2 (two) times daily.         . furosemide (LASIX) 20 MG tablet   Oral   Take 20 mg by mouth daily.         Marland Kitchen glimepiride (AMARYL) 4 MG tablet   Oral   Take 4 mg by mouth daily with breakfast.         . HYDROcodone-acetaminophen (NORCO/VICODIN) 5-325 MG per tablet   Oral   Take 1 tablet by mouth every 6 (six) hours as needed for moderate pain.         Marland Kitchen insulin detemir (LEVEMIR) 100 UNIT/ML injection   Subcutaneous   Inject 20 Units into the skin daily. Hold if CBG < 120.   10 mL      . metFORMIN (GLUCOPHAGE) 500 MG tablet   Oral   Take 1,000 mg by mouth 2 (two) times daily with a meal.         . metoprolol succinate (TOPROL-XL) 25 MG 24 hr tablet   Oral   Take 25 mg by mouth 2 (two) times daily.         Marland Kitchen omega-3 acid ethyl esters (LOVAZA) 1 G capsule   Oral   Take 1 g by  mouth daily.         . polyethylene glycol (MIRALAX / GLYCOLAX) packet   Oral   Take 17 g by mouth daily.         . QUEtiapine (SEROQUEL) 100 MG tablet   Oral   Take 100 mg by mouth daily. Take along with 50mg  tablet to make a total of 150mg  dose         . QUEtiapine (SEROQUEL) 200 MG tablet   Oral   Take 200 mg by mouth at bedtime.         Marland Kitchen QUEtiapine (SEROQUEL) 50 MG tablet   Oral   Take 50 mg by mouth daily. Take along with 100mg  tablet to make a total dose of 150mg          . saccharomyces boulardii (FLORASTOR) 250 MG capsule   Oral   Take 1 capsule (250 mg total) by mouth 2 (two) times daily.         . Tamsulosin HCl (FLOMAX) 0.4 MG CAPS   Oral   Take 0.4 mg by mouth every evening.          . vitamin B-12 (CYANOCOBALAMIN) 1000 MCG tablet   Oral   Take 1,000 mcg by mouth daily.         Marland Kitchen zolpidem (AMBIEN) 5 MG tablet   Oral   Take 5 mg by mouth at bedtime as needed for sleep.         Marland Kitchen PRESCRIPTION MEDICATION   Topical   Apply 1 mg topically daily as needed. For agitation   Ativan Topical Gel          BP 153/86  Pulse 68  Temp(Src) 97.6  F (36.4 C) (Oral)  Resp 17  SpO2 100% Physical Exam  Constitutional: He appears well-developed and well-nourished.  HENT:  Head: Normocephalic and atraumatic.  No areas of tenderness or deformity  Eyes: EOM are normal. Pupils are equal, round, and reactive to light.  Neck: Neck supple.  No midline cervical tenderness or deformity  Cardiovascular: Normal rate, regular rhythm and intact distal pulses.   Pulmonary/Chest: Effort normal and breath sounds normal. No respiratory distress. He exhibits no tenderness.  Abdominal: Soft. He exhibits no distension. There is no tenderness.  Musculoskeletal: Normal range of motion. He exhibits no edema.  Moves all extremities x4 without deformity  Neurological: He is alert.  Oriented to self and place, no focal or unilateral deficits  Skin: Skin is warm and dry.    ED Course  Procedures (including critical care time) Labs Review Labs Reviewed  GLUCOSE, CAPILLARY - Abnormal; Notable for the following:    Glucose-Capillary >600 (*)    All other components within normal limits  CBC - Abnormal; Notable for the following:    Platelets 149 (*)    All other components within normal limits  COMPREHENSIVE METABOLIC PANEL - Abnormal; Notable for the following:    Sodium 131 (*)    Chloride 95 (*)    Glucose, Bld 524 (*)    GFR calc non Af Amer 80 (*)    All other components within normal limits  URINALYSIS, ROUTINE W REFLEX MICROSCOPIC - Abnormal; Notable for the following:    APPearance CLOUDY (*)    Glucose, UA >1000 (*)    All other components within normal limits  GLUCOSE, CAPILLARY - Abnormal; Notable for the following:    Glucose-Capillary 448 (*)    All other components within normal limits  GLUCOSE, CAPILLARY - Abnormal; Notable  for the following:    Glucose-Capillary 342 (*)    All other components within normal limits  POCT I-STAT, CHEM 8 - Abnormal; Notable for the following:    Glucose, Bld 521 (*)    All other components within  normal limits  LIPASE, BLOOD  URINE MICROSCOPIC-ADD ON   Imaging Review Ct Head Wo Contrast  05/17/2013   CLINICAL DATA:  Status post fall; concern for head injury.  EXAM: CT HEAD WITHOUT CONTRAST  TECHNIQUE: Contiguous axial images were obtained from the base of the skull through the vertex without intravenous contrast.  COMPARISON:  CT of the head performed 04/07/2012, and MRI of the brain performed 04/12/2012  FINDINGS: There is no evidence of acute infarction, mass lesion, or intra- or extra-axial hemorrhage on CT.  Prominence of the ventricles and sulci reflects mild cortical volume loss. Mild periventricular white matter change likely reflects small vessel ischemic microangiopathy.  The brainstem and fourth ventricle are within normal limits. The basal ganglia are unremarkable in appearance. The cerebral hemispheres demonstrate grossly normal gray-white differentiation. No mass effect or midline shift is seen.  There is no evidence of fracture; visualized osseous structures are unremarkable in appearance. The orbits are within normal limits. The paranasal sinuses and mastoid air cells are well-aerated. No significant soft tissue abnormalities are seen.  IMPRESSION: 1. No evidence of traumatic intracranial injury or fracture. 2. Mild cortical volume loss and scattered small vessel ischemic microangiopathy.   Electronically Signed   By: Roanna Raider M.D.   On: 05/17/2013 02:39   Dg Chest Portable 1 View  05/17/2013   CLINICAL DATA:  Status post fall; concern for chest injury.  EXAM: PORTABLE CHEST - 1 VIEW  COMPARISON:  Chest radiograph performed 04/07/2012  FINDINGS: The lungs are hypoexpanded. Mild vascular crowding and vascular congestion are seen, without evidence of pulmonary edema. No pleural effusion or pneumothorax is seen.  The cardiomediastinal silhouette is borderline normal in size. The patient is status post median sternotomy, with evidence of prior CABG. No acute osseous abnormalities  are seen.  IMPRESSION: Lungs hypoexpanded; mild vascular congestion noted, possibly transient in nature. No displaced rib fractures identified.   Electronically Signed   By: Roanna Raider M.D.   On: 05/17/2013 02:58    EKG Interpretation    Date/Time:  Wednesday May 17 2013 02:13:59 EST Ventricular Rate:  74 PR Interval:  239 QRS Duration: 127 QT Interval:  367 QTC Calculation: 407 R Axis:   -106 Text Interpretation:  Sinus rhythm Prolonged PR interval Nonspecific IVCD with LAD Inferior infarct, age indeterminate Abnormal ECG Confirmed by Ariellah Faust  MD, Penelope Fittro [phone]) on 05/17/2013 5:27:03 AM           patient ambulates with assistance no acute distress.  IV fluids. IV glucose stabilizer. Serial evaluations with improving blood sugars. Patient stable for discharge back to nursing facility. Plan close followup with primary care physician.  MDM  Diagnosis: Hyperglycemia, history of dementia  EKG, imaging, labs obtained and reviewed as above. Serial evaluations performed patient does not have any pain or trauma complains. Improved with IV fluids and medications. Vital signs and nursing notes reviewed and considered.  Sunnie Nielsen, MD 05/17/13 [phone]

## 2013-05-17 NOTE — ED Notes (Signed)
Patient refused vitals to be retaken.

## 2013-05-17 NOTE — BHH Counselor (Signed)
This Clinical research associate recv'd call from [place] [facility] about a request for a telepsych w/o any further details as to why a telepsych is needed for this patient.  This Clinical research associate called Dr. Lynelle Doctor to obtain clinical information--EDP is requesting telepsych for med recommendation due to agitation and combativeness.

## 2013-05-17 NOTE — ED Notes (Signed)
MD at bedside. 

## 2013-05-18 ENCOUNTER — Encounter (HOSPITAL_COMMUNITY): Payer: Self-pay | Admitting: Registered Nurse

## 2013-05-18 DIAGNOSIS — F603 Borderline personality disorder: Secondary | ICD-10-CM

## 2013-05-18 DIAGNOSIS — R4182 Altered mental status, unspecified: Secondary | ICD-10-CM

## 2013-05-18 LAB — GLUCOSE, CAPILLARY
Glucose-Capillary: 226 mg/dL — ABNORMAL HIGH (ref 70–99)
Glucose-Capillary: 212 mg/dL — ABNORMAL HIGH (ref 70–99)

## 2013-05-18 MED ORDER — LORAZEPAM 2 MG/ML IJ SOLN
2.0000 mg | Freq: Once | INTRAMUSCULAR | Status: AC
  Administered 2013-05-18: 2 mg via INTRAMUSCULAR
  Filled 2013-05-18: qty 1

## 2013-05-18 NOTE — ED Notes (Signed)
Patient resting in position of comfort with eyes closed RR WNL--even and unlabored with equal rise and fall of chest Patient in NAD Side rails up, call bell in reach  Sitter remains at bedside  

## 2013-05-18 NOTE — ED Notes (Signed)
Pt has a bag of belongings consisting of a pair of blue pants, a maroon long sleeve pullover shirt, a pair of underwear, a pair of brown sandal shoes.  Pt also has a gold colored analog watch.  Pt belongs are put in locker 26.

## 2013-05-18 NOTE — Progress Notes (Signed)
EDRN requesting TTS consult.  EDCM spoke to ACT team member who placed phone call to [facility] to assess patient.  Discussed patient with EDP.

## 2013-05-18 NOTE — Progress Notes (Addendum)
Pt referred to [facility] pending reivew.  Pt referred to [facility] pending review.  Pt referred to [facility] pending review.    No beds at [facility], [place], [place].   Pt declined from [place], [place].   Catha Gosselin, LCSW [phone]  ED CSW .05/18/2013   Per Dr. Jacky Kindle, patient not appropriate for [facility]   Catha Gosselin, LCSW [phone]  ED CSW .05/18/2013 1439pm   Pt referred to [facility] pending reivew.   Catha Gosselin, LCSW [phone]  ED CSW .05/18/2013 1444pm

## 2013-05-18 NOTE — ED Notes (Signed)
Patient confused, disoriented to time, place and situation Patient becoming increasingly agitated and uncooperative when attempted to be redirected by ED staff Patient offered PO PRN Ativan--refused Patient medicated with Ativan IM

## 2013-05-18 NOTE — Progress Notes (Signed)
CSW lm for patient spouse. Patient to remain in ed be assess in the morning while pending geri psych placement.   Catha Gosselin, LCSW [phone]  ED CSW .05/18/2013 1540pm

## 2013-05-18 NOTE — Progress Notes (Signed)
[place]: 830 No male beds available [place]: Refax Referrral [place]: @2315  Victorino Dike requested CBG results to be faxed, once faxed called back to confirm that pt had been accepted for admission on 05/19/13 after 0700 accepted by Dr. Prudencio Pair. Victorino Dike requested IVC paperwork to be faxed to [phone]. Spoke with Arthur(Nursing Secretary) who stated he would fax IVC paperwork due to RN being involved in an emergency. This Clinical research associate will call back to present disposition to RN within an hour so EDP could be made aware.  Collie Wernick Cathey, MHT

## 2013-05-18 NOTE — ED Notes (Signed)
Patient awake, ambulatory to bathroom--escorted by sitter Patient with steady gait Patient in NAD at this time

## 2013-05-18 NOTE — Progress Notes (Signed)
[facility] at [phone] stated they are at capacity but will be having discharges this morning please call back to check.  Jonathon Lane, MHT

## 2013-05-18 NOTE — Consult Note (Signed)
Face to face consult/interview with Dr. Ladona Ridgel  Third attempt to visit patient related that patient has been sleeping on previous 2 attempts of consult.  Sitter at bed side states that patient has been sleep all day stating that he was sleep. ON this visit attempted to wake patient. Patient states "em sleepy"  Patient did open his eyes and stated that he was tired and sleep.  "I need to eat my breakfast"  Inform patient that it was past lunch and encouraged to eat.  Patient states that everything is "all right"  Unable to do full consult patient  Need to attempt to reassess patient tomorrow when he is not as tired/sleepy and able to keep his eyes open.  [name] FNP-BC Family Nurse Practitioner, Board Certified

## 2013-05-18 NOTE — ED Provider Notes (Signed)
9:10 PM called to see patient as he had become agitated.  He is refusing to take his po medications.  I have briefly reviewed patient chart and ordered IM ativan for agitiation.  Police are at bedside with security and are talking with patient, they have gotten him to return to his room.  Pt is currently under IVC paperwork.   Ethelda Chick, MD 05/18/13 2111

## 2013-05-18 NOTE — ED Notes (Signed)
Security has wanded the patient and searched his belongings.  Pt does have metal on the inside of his legs suggesting some bone repair work that is not listed in the patients chart.

## 2013-05-18 NOTE — ED Notes (Signed)
[facility] assessment team called and asked about pt and pt's condition.  [facility] team were advised that pt had been acting out and aggressive.  [facility] team advised pt had been cooperative and resting since Nurse had come on duty at 1900 hrs.  Nursing asked about medication orders which had been held due to contraindication.  [facility] team advised they would discontinue and that a psychiatrist would see pt in the morning to make a full in person assessment

## 2013-05-19 NOTE — ED Notes (Signed)
Patient resting in position of comfort with eyes closed RR WNL--even and unlabored with equal rise and fall of chest Patient in NAD Side rails up, call bell in reach  

## 2013-05-19 NOTE — ED Notes (Signed)
Jonathon Lane with [facility] called to inform ED staff that patient has been accepted to [facility] Report can be called tomorrow morning after 0800 Accepting MD: Dr. Janice Norrie

## 2013-05-19 NOTE — ED Notes (Signed)
Report called to Landry Corporal , [place].

## 2013-05-19 NOTE — BHH Counselor (Signed)
Per Jennifer([place]), Pt accepted by Dr. Retia Passe, call report to [phone].

## 2013-05-19 NOTE — ED Notes (Signed)
Patient awake Patient ambulated to bathroom without assistance Patient given meal, drink Patient appears in NAD at this time and denies further needs

## 2013-05-19 NOTE — ED Notes (Signed)
Per Belenda Cruise social work, hold pt for transport. IVC paperwork needs to be completed by officer.

## 2013-05-19 NOTE — BH Assessment (Signed)
BHH Assessment Progress Note   Pt accepted to [facility].  Pt on IVC.  This clinician spoke with nurse Irving Burton who said that [facility] could not take patient until after 8:00am.  Clinician left a message with [facility] that patient would need transport but would not be able to go until after 08:00.

## 2013-05-23 NOTE — Consult Note (Signed)
Note reviewed and agreed. 

## 2013-06-23 ENCOUNTER — Emergency Department (HOSPITAL_COMMUNITY)
Admission: EM | Admit: 2013-06-23 | Discharge: 2013-06-28 | Payer: Medicare Other | Attending: Emergency Medicine | Admitting: Emergency Medicine

## 2013-06-23 ENCOUNTER — Encounter (HOSPITAL_COMMUNITY): Payer: Self-pay | Admitting: Emergency Medicine

## 2013-06-23 DIAGNOSIS — F911 Conduct disorder, childhood-onset type: Secondary | ICD-10-CM | POA: Insufficient documentation

## 2013-06-23 DIAGNOSIS — F0390 Unspecified dementia without behavioral disturbance: Secondary | ICD-10-CM | POA: Insufficient documentation

## 2013-06-23 DIAGNOSIS — I10 Essential (primary) hypertension: Secondary | ICD-10-CM | POA: Insufficient documentation

## 2013-06-23 DIAGNOSIS — Z951 Presence of aortocoronary bypass graft: Secondary | ICD-10-CM | POA: Insufficient documentation

## 2013-06-23 DIAGNOSIS — Z794 Long term (current) use of insulin: Secondary | ICD-10-CM | POA: Insufficient documentation

## 2013-06-23 DIAGNOSIS — Z79899 Other long term (current) drug therapy: Secondary | ICD-10-CM | POA: Insufficient documentation

## 2013-06-23 DIAGNOSIS — R4689 Other symptoms and signs involving appearance and behavior: Secondary | ICD-10-CM

## 2013-06-23 DIAGNOSIS — F329 Major depressive disorder, single episode, unspecified: Secondary | ICD-10-CM | POA: Insufficient documentation

## 2013-06-23 DIAGNOSIS — Z7982 Long term (current) use of aspirin: Secondary | ICD-10-CM | POA: Insufficient documentation

## 2013-06-23 DIAGNOSIS — R4182 Altered mental status, unspecified: Secondary | ICD-10-CM

## 2013-06-23 DIAGNOSIS — Z87891 Personal history of nicotine dependence: Secondary | ICD-10-CM | POA: Insufficient documentation

## 2013-06-23 DIAGNOSIS — E119 Type 2 diabetes mellitus without complications: Secondary | ICD-10-CM | POA: Insufficient documentation

## 2013-06-23 DIAGNOSIS — N179 Acute kidney failure, unspecified: Secondary | ICD-10-CM

## 2013-06-23 DIAGNOSIS — Z87448 Personal history of other diseases of urinary system: Secondary | ICD-10-CM | POA: Insufficient documentation

## 2013-06-23 DIAGNOSIS — F3289 Other specified depressive episodes: Secondary | ICD-10-CM | POA: Insufficient documentation

## 2013-06-23 HISTORY — DX: Depression, unspecified: F32.A

## 2013-06-23 HISTORY — DX: Major depressive disorder, single episode, unspecified: F32.9

## 2013-06-23 LAB — BASIC METABOLIC PANEL
BUN: 12 mg/dL (ref 6–23)
CO2: 26 mEq/L (ref 19–32)
Calcium: 8.8 mg/dL (ref 8.4–10.5)
Chloride: 102 mEq/L (ref 96–112)
Creatinine, Ser: 0.92 mg/dL (ref 0.50–1.35)
GFR calc Af Amer: >90 mL/min (ref >90)
GFR calc non Af Amer: 79 mL/min — ABNORMAL LOW (ref >90)
Glucose, Bld: 218 mg/dL — ABNORMAL HIGH (ref 70–99)
Potassium: 4.2 mEq/L (ref 3.7–5.3)
Sodium: 142 mEq/L (ref 137–147)

## 2013-06-23 LAB — CBC WITH DIFFERENTIAL/PLATELET
Basophils Absolute: 0.1 10*3/uL (ref 0.0–0.1)
Basophils Relative: 1 % (ref 0–1)
Eosinophils Absolute: 0.9 10*3/uL — ABNORMAL HIGH (ref 0.0–0.7)
Eosinophils Relative: 11 % — ABNORMAL HIGH (ref 0–5)
HCT: 36.4 % — ABNORMAL LOW (ref 39.0–52.0)
Hemoglobin: 12.4 g/dL — ABNORMAL LOW (ref 13.0–17.0)
Lymphocytes Relative: 32 % (ref 12–46)
Lymphs Abs: 2.6 10*3/uL (ref 0.7–4.0)
MCH: 31.2 pg (ref 26.0–34.0)
MCHC: 34.1 g/dL (ref 30.0–36.0)
MCV: 91.5 fL (ref 78.0–100.0)
Monocytes Absolute: 0.7 10*3/uL (ref 0.1–1.0)
Monocytes Relative: 9 % (ref 3–12)
Neutro Abs: 3.8 10*3/uL (ref 1.7–7.7)
Neutrophils Relative %: 48 % (ref 43–77)
Platelets: 132 10*3/uL — ABNORMAL LOW (ref 150–400)
RBC: 3.98 MIL/uL — ABNORMAL LOW (ref 4.22–5.81)
RDW: 14.7 % (ref 11.5–15.5)
WBC: 8.1 10*3/uL (ref 4.0–10.5)

## 2013-06-23 MED ORDER — LORAZEPAM 2 MG/ML IJ SOLN
1.0000 mg | Freq: Once | INTRAMUSCULAR | Status: AC
  Administered 2013-06-23: 1 mg via INTRAMUSCULAR
  Filled 2013-06-23: qty 1

## 2013-06-23 MED ORDER — HALOPERIDOL LACTATE 5 MG/ML IJ SOLN
5.0000 mg | Freq: Once | INTRAMUSCULAR | Status: AC
  Administered 2013-06-23: 5 mg via INTRAMUSCULAR
  Filled 2013-06-23: qty 1

## 2013-06-23 MED ORDER — HALOPERIDOL LACTATE 5 MG/ML IJ SOLN
INTRAMUSCULAR | Status: AC
  Filled 2013-06-23: qty 1
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
## 2013-06-23 NOTE — ED Notes (Addendum)
Pt's daughter [name] called and gave medical information on pt.  Per pt's daughter pt had psychotic break 5 years ago including hallucinations (auditory/visual) and was placed in psychiatric facility in [place].  Since then pt has been in 6 ALF, 5 IVC's and 1 rehab center.  Pt's Wife [name] is Guardian of person d/t pt being deemed incompetent in court her number is [phone].  Pt's daughter said [facility] is hoping that pt will be IVC'd here so that can return to the geripsych facility in hospital.  Pt would like to be contacted in the am to help w/ d/c plans.  Beth can be reached at [phone].

## 2013-06-23 NOTE — ED Notes (Addendum)
Pt is HOH, aggressive, repeatedly asking about his wife.  Pt appears to be alert to self only.  Pt will not answer any questions at this time.
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
## 2013-06-24 ENCOUNTER — Emergency Department (HOSPITAL_COMMUNITY): Payer: Medicare Other

## 2013-06-24 DIAGNOSIS — F0390 Unspecified dementia without behavioral disturbance: Secondary | ICD-10-CM

## 2013-06-24 DIAGNOSIS — F603 Borderline personality disorder: Secondary | ICD-10-CM

## 2013-06-24 DIAGNOSIS — R4689 Other symptoms and signs involving appearance and behavior: Secondary | ICD-10-CM | POA: Diagnosis present

## 2013-06-24 LAB — URINALYSIS, ROUTINE W REFLEX MICROSCOPIC
Bilirubin Urine: NEGATIVE
Glucose, UA: NEGATIVE mg/dL
Hgb urine dipstick: NEGATIVE
Ketones, ur: NEGATIVE mg/dL
Leukocytes, UA: NEGATIVE
Nitrite: NEGATIVE
Protein, ur: NEGATIVE mg/dL
Specific Gravity, Urine: 1.013 (ref 1.005–1.030)
Urobilinogen, UA: 0.2 mg/dL (ref 0.0–1.0)
pH: 7 (ref 5.0–8.0)

## 2013-06-24 LAB — VALPROIC ACID LEVEL: Valproic Acid Lvl: <10 ug/mL — ABNORMAL LOW (ref 50.0–100.0)

## 2013-06-24 LAB — GLUCOSE, CAPILLARY: Glucose-Capillary: 136 mg/dL — ABNORMAL HIGH (ref 70–99)

## 2013-06-24 MED ORDER — TAMSULOSIN HCL 0.4 MG PO CAPS
0.4000 mg | ORAL_CAPSULE | Freq: Every evening | ORAL | Status: DC
  Administered 2013-06-26 – 2013-06-28 (×3): 0.4 mg via ORAL
  Filled 2013-06-24 (×3): qty 1

## 2013-06-24 MED ORDER — INSULIN DETEMIR 100 UNIT/ML ~~LOC~~ SOLN: 20.0000 [IU] | Freq: Every morning | SUBCUTANEOUS | Status: DC

## 2013-06-24 MED ORDER — GLIMEPIRIDE 4 MG PO TABS
4.0000 mg | ORAL_TABLET | Freq: Every day | ORAL | Status: DC
  Administered 2013-06-25 – 2013-06-28 (×4): 4 mg via ORAL
  Filled 2013-06-24 (×6): qty 1

## 2013-06-24 MED ORDER — DIVALPROEX SODIUM ER 500 MG PO TB24
1000.0000 mg | ORAL_TABLET | Freq: Every evening | ORAL | Status: DC
  Filled 2013-06-24 (×5): qty 2

## 2013-06-24 MED ORDER — POLYETHYLENE GLYCOL 3350 17 G PO PACK
17.0000 g | PACK | Freq: Every day | ORAL | Status: DC
Start: 2013-06-24 — End: 2013-06-28
  Administered 2013-06-25 – 2013-06-28 (×3): 17 g via ORAL
  Filled 2013-06-24 (×5): qty 1

## 2013-06-24 MED ORDER — FUROSEMIDE 20 MG PO TABS
20.0000 mg | ORAL_TABLET | Freq: Every morning | ORAL | Status: DC
  Administered 2013-06-25 – 2013-06-28 (×4): 20 mg via ORAL
  Filled 2013-06-24 (×5): qty 1

## 2013-06-24 MED ORDER — METFORMIN HCL 500 MG PO TABS
1000.0000 mg | ORAL_TABLET | Freq: Two times a day (BID) | ORAL | Status: DC
  Administered 2013-06-25 – 2013-06-28 (×7): 1000 mg via ORAL
  Filled 2013-06-24 (×12): qty 2

## 2013-06-24 MED ORDER — VENLAFAXINE HCL 37.5 MG PO TABS
37.5000 mg | ORAL_TABLET | Freq: Every evening | ORAL | Status: DC
  Administered 2013-06-25: 37.5 mg via ORAL
  Filled 2013-06-24 (×5): qty 1

## 2013-06-24 MED ORDER — METOPROLOL SUCCINATE ER 25 MG PO TB24
25.0000 mg | ORAL_TABLET | Freq: Every morning | ORAL | Status: DC
  Administered 2013-06-24 – 2013-06-28 (×4): 25 mg via ORAL
  Filled 2013-06-24 (×5): qty 1

## 2013-06-24 MED ORDER — VITAMIN C 500 MG PO TABS
500.0000 mg | ORAL_TABLET | Freq: Two times a day (BID) | ORAL | Status: DC
  Administered 2013-06-25 – 2013-06-28 (×6): 500 mg via ORAL
  Filled 2013-06-24 (×11): qty 1

## 2013-06-24 MED ORDER — QUETIAPINE FUMARATE 100 MG PO TABS
100.0000 mg | ORAL_TABLET | Freq: Every day | ORAL | Status: DC
  Administered 2013-06-24 – 2013-06-28 (×5): 100 mg via ORAL
  Filled 2013-06-24 (×5): qty 1

## 2013-06-24 MED ORDER — HYDROCODONE-ACETAMINOPHEN 5-325 MG PO TABS: 1.0000 | ORAL_TABLET | Freq: Four times a day (QID) | ORAL | Status: DC | PRN

## 2013-06-24 MED ORDER — ASPIRIN 81 MG PO CHEW
81.0000 mg | CHEWABLE_TABLET | Freq: Every day | ORAL | Status: DC
  Administered 2013-06-26 – 2013-06-27 (×2): 81 mg via ORAL
  Filled 2013-06-24 (×3): qty 1

## 2013-06-24 MED ORDER — CLONAZEPAM 0.5 MG PO TABS
0.2500 mg | ORAL_TABLET | Freq: Three times a day (TID) | ORAL | Status: DC
  Administered 2013-06-24 – 2013-06-28 (×12): 0.25 mg via ORAL
  Filled 2013-06-24 (×13): qty 1

## 2013-06-24 MED ORDER — QUETIAPINE FUMARATE 50 MG PO TABS
50.0000 mg | ORAL_TABLET | Freq: Every day | ORAL | Status: DC
  Administered 2013-06-24 – 2013-06-28 (×5): 50 mg via ORAL
  Filled 2013-06-24 (×5): qty 1

## 2013-06-24 MED ORDER — INSULIN ASPART 100 UNIT/ML ~~LOC~~ SOLN: 8.0000 [IU] | Freq: Two times a day (BID) | SUBCUTANEOUS | Status: DC | PRN

## 2013-06-24 MED ORDER — ZOLPIDEM TARTRATE 5 MG PO TABS: 5.0000 mg | ORAL_TABLET | Freq: Every evening | ORAL | Status: DC | PRN

## 2013-06-24 MED ORDER — QUETIAPINE FUMARATE 100 MG PO TABS
200.0000 mg | ORAL_TABLET | Freq: Every day | ORAL | Status: DC
Start: 2013-06-24 — End: 2013-06-28
  Administered 2013-06-26 – 2013-06-27 (×2): 200 mg via ORAL
  Filled 2013-06-24 (×4): qty 2

## 2013-06-24 MED ORDER — HALOPERIDOL 5 MG PO TABS
10.0000 mg | ORAL_TABLET | Freq: Every day | ORAL | Status: DC
  Administered 2013-06-26 – 2013-06-27 (×2): 10 mg via ORAL
  Filled 2013-06-24 (×3): qty 2

## 2013-06-24 MED ORDER — VITAMIN B-12 1000 MCG PO TABS
1000.0000 ug | ORAL_TABLET | Freq: Every day | ORAL | Status: DC
  Administered 2013-06-25 – 2013-06-28 (×4): 1000 ug via ORAL
  Filled 2013-06-24 (×5): qty 1

## 2013-06-24 MED ORDER — VENLAFAXINE HCL 75 MG PO TABS
75.0000 mg | ORAL_TABLET | Freq: Every day | ORAL | Status: DC
  Administered 2013-06-26 – 2013-06-27 (×2): 75 mg via ORAL
  Filled 2013-06-24 (×7): qty 1

## 2013-06-24 MED ORDER — SACCHAROMYCES BOULARDII 250 MG PO CAPS
250.0000 mg | ORAL_CAPSULE | Freq: Two times a day (BID) | ORAL | Status: DC
  Administered 2013-06-25 – 2013-06-28 (×6): 250 mg via ORAL
  Filled 2013-06-24 (×12): qty 1

## 2013-06-24 MED ORDER — HALOPERIDOL 5 MG PO TABS
2.5000 mg | ORAL_TABLET | ORAL | Status: DC
  Administered 2013-06-24 – 2013-06-28 (×5): 2.5 mg via ORAL
  Filled 2013-06-24 (×5): qty 1

## 2013-06-24 MED ORDER — CLOTRIMAZOLE 1 % EX CREA: 1.0000 "application " | TOPICAL_CREAM | Freq: Two times a day (BID) | CUTANEOUS | Status: DC

## 2013-06-24 MED ORDER — INSULIN DETEMIR 100 UNIT/ML ~~LOC~~ SOLN
20.0000 [IU] | Freq: Every day | SUBCUTANEOUS | Status: DC
  Administered 2013-06-25 – 2013-06-28 (×4): 20 [IU] via SUBCUTANEOUS
  Filled 2013-06-24 (×6): qty 0.2

## 2013-06-24 NOTE — ED Notes (Signed)
Pt's Belongings  1 pair blue pants 1 pair underwear 1 white under shirt 1 blue long sleeve shirt 1 watch 1 pair of glasses 1 pair of tan shoes.

## 2013-06-24 NOTE — ED Notes (Signed)
Daughter Beth at bedside

## 2013-06-24 NOTE — ED Notes (Signed)
Patient is resting comfortably. 

## 2013-06-24 NOTE — ED Notes (Signed)
Bed: WA29 Expected date:  Expected time:  Means of arrival:  Comments: 

## 2013-06-24 NOTE — Progress Notes (Signed)
Placed call to [facility] and spoke with Delorise ShinerGrace from their assessment department, stated they were familiar with pt and he had just been d/c'd from their facility on 1.13.15 after being there approximately 32 days.  Delorise ShinerGrace stated she would discuss pt with their physician, Dr. Prudencio PairLowry, when he got in to see if they would accept pt back.  Will call TTS back with update.   Tomi BambergerMariya Mirjana Tarleton, MHT

## 2013-06-24 NOTE — Progress Notes (Signed)
Writer was informed by the Charge Nurse regarding that the patient received (1mg ) of Ativan and (5mg ) of Halidol due to aggression.   Writer informed the [facility] Office that the patient will need to be assessed in the morning.
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
## 2013-06-24 NOTE — Consult Note (Signed)
Pt placed under IVC by Dr. Fayrene FearingJames.  Faxed to [employer]istrate.  Magistrate busy at [phone] and cannot confirm fax receipt for pt.
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
## 2013-06-24 NOTE — ED Notes (Signed)
Pt refusing to take medications. Stating that he wants his wife here.

## 2013-06-24 NOTE — ED Notes (Signed)
Pt up at bedside stating that he is not going to take any medications. Charge nurse notified. MD notified.

## 2013-06-24 NOTE — Progress Notes (Signed)
CSW called magistrate. [place] confirmed receipt of IVC. [place] approved and sheriff to be dispatched shortly.  Jonathon Lane LCSWA, [phone]     ED CSW  3:36pm  
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
## 2013-06-24 NOTE — ED Provider Notes (Signed)
I received a call from the psychiatric evaluation team the patient was being reviewed for admission in [place]. Patient criteria that required EKG and chest x-ray. These were ordered. His EKG shows sinus bradycardia with first degree AV block. No acute abnormalities or ischemic changes. There is no change in his EKG from his most recent comparison.  Chest x-ray shows some atelectasis at the right base. I have evaluated him. His lungs are clear. He is not tachypneic. On recheck pulse ox is 98% on room air. Recheck temperature is 97.3. He is not symptomatic with cough fever or subjective shortness or breath chest pain or other symptoms. My diagnosis on his chest x-ray is right lower lobe atelectasis.  I would consider him medically clear for inpatient psychiatric care.  Rolland PorterMark Melanni Benway, MD 06/24/13 1426

## 2013-06-25 DIAGNOSIS — R4182 Altered mental status, unspecified: Secondary | ICD-10-CM

## 2013-06-25 LAB — GLUCOSE, CAPILLARY: Glucose-Capillary: 140 mg/dL — ABNORMAL HIGH (ref 70–99)

## 2013-06-25 MED ORDER — HALOPERIDOL LACTATE 5 MG/ML IJ SOLN
5.0000 mg | Freq: Once | INTRAMUSCULAR | Status: AC
  Administered 2013-06-25: 5 mg via INTRAMUSCULAR
  Filled 2013-06-25: qty 1

## 2013-06-25 NOTE — ED Provider Notes (Signed)
Called for medication order.  Pt has become more agitated this am.  Wants to get out of bed.  Previously was given 5 mg haldol on the 16th without adverse effect.  Will give haldol 5 mg im  Celene KrasJon R Adiba Fargnoli, MD 06/25/13 1030

## 2013-06-25 NOTE — ED Notes (Signed)
Patient refused all 2200 medications 

## 2013-06-25 NOTE — ED Notes (Signed)
Breakfast tray was given.  

## 2013-06-25 NOTE — ED Notes (Addendum)
cbg was 140. Nurse was notified

## 2013-06-25 NOTE — Progress Notes (Signed)
Placed call to follow up with [facility] regarding pt being re-admitted.  Per Delorise ShinerGrace in admitting pt has been declined by both Dr. Prudencio PairLowry and their on call psychiatrist feeling as though pt needs higher level of care and they have done all they feel they can do for him at their facility after being there for over a month.   Tomi BambergerMariya Briani Maul Disposition MHT

## 2013-06-25 NOTE — Progress Notes (Signed)
The following facilities have been contacted regarding int on pt's behalf:   [facility]- per Joni ReiningNicole, do not take pt's diagnosed with dementia on their gero-psych unit [facility]- per [place], do not take pt's diagnosed with dementia on their gero-psych unit  [facility]- per [place], do not have gero-psych unit [facility]- per Okey Regalarol do not take pt's that are [facility]- do not take pt's diagnosed with dementia   Tomi BambergerMariya Tamelia Michalowski Disposition MHT

## 2013-06-25 NOTE — ED Notes (Signed)
Pt. Refused vital signs. RN, Cheek made aware.
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
## 2013-06-25 NOTE — ED Notes (Signed)
Pt.tore his blue scrubs. NT, Allred and NT, Ayeena changed pt. In new blue scrubs.

## 2013-06-25 NOTE — Progress Notes (Signed)
Patient agitated attempting to leave yelling "where's my shoes" and trying to find an exit. Patient is pacing the unit and being verbally aggressive notified security for support. Called patient's wife [name] to talk to patient and calm him down. After speaking to wife patient returned to room to and laid in  bed.  Resting comfortably will continue to monitor.

## 2013-06-25 NOTE — ED Notes (Signed)
Patient continues to refuse vital signs to be taken. States he his comfortable and would like to be left alone.

## 2013-06-26 DIAGNOSIS — F0391 Unspecified dementia with behavioral disturbance: Secondary | ICD-10-CM

## 2013-06-26 DIAGNOSIS — F03918 Unspecified dementia, unspecified severity, with other behavioral disturbance: Secondary | ICD-10-CM

## 2013-06-26 MED ORDER — ZIPRASIDONE MESYLATE 20 MG IM SOLR: 20.0000 mg | Freq: Once | INTRAMUSCULAR | Status: AC

## 2013-06-26 MED ORDER — ZIPRASIDONE MESYLATE 20 MG IM SOLR
INTRAMUSCULAR | Status: AC
  Filled 2013-06-26: qty 20

## 2013-06-26 MED ORDER — ZIPRASIDONE MESYLATE 20 MG IM SOLR
10.0000 mg | Freq: Once | INTRAMUSCULAR | Status: AC
  Administered 2013-06-26: 10 mg via INTRAMUSCULAR
  Filled 2013-06-26: qty 20

## 2013-06-26 MED ORDER — STERILE WATER FOR INJECTION IJ SOLN
INTRAMUSCULAR | Status: AC
  Administered 2013-06-26: 1.2 mL
  Filled 2013-06-26: qty 10

## 2013-06-26 MED ORDER — ZIPRASIDONE MESYLATE 20 MG IM SOLR
10.0000 mg | Freq: Once | INTRAMUSCULAR | Status: AC
  Administered 2013-06-26: 10 mg via INTRAMUSCULAR

## 2013-06-26 MED ORDER — STERILE WATER FOR INJECTION IJ SOLN
INTRAMUSCULAR | Status: AC
  Administered 2013-06-26: 13:00:00
  Filled 2013-06-26: qty 10

## 2013-06-26 MED ORDER — ZIPRASIDONE MESYLATE 20 MG IM SOLR
INTRAMUSCULAR | Status: AC
  Administered 2013-06-26: 10 mg via INTRAMUSCULAR
  Filled 2013-06-26: qty 20

## 2013-06-26 NOTE — ED Notes (Signed)
Patient is continuing to act inappropriately. Very aggressive behavior . Trying to leave. Made Dr. Rhunette Croftnanavati aware orders given . Carried out. Patient placed back in bed safely.
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
## 2013-06-26 NOTE — ED Notes (Signed)
Pt cooperating at this time. In bed watching tv. Pt ambulated to bathroom with assistance. Urinated on floor and himself on accident. Pants and socks changed. No acute distress at this time.

## 2013-06-26 NOTE — BHH Counselor (Signed)
Writer spoke w/ RN at [facility] [phone] who sts they may have one d/c later today and sts to fax referral info. Writer [facility].  Evette Cristalaroline Paige Tapanga [place], [place] Assessment Counselor

## 2013-06-26 NOTE — ED Notes (Signed)
Patient refused midnight vital signs. Assisted patient in repositioning in stretcher. Unwilling to cooperate. Will monitor  patient  Closely to ensure safety.

## 2013-06-26 NOTE — ED Notes (Signed)
Patient is very aggitated and aggressive with staff. Requesting for shoes so he can leave to take care of child. Patient refused to get back in bed. Security called and attending MD made aware of behavior and orders given for 10mg  of IM geodon once now. Will carry out

## 2013-06-27 ENCOUNTER — Encounter (HOSPITAL_COMMUNITY): Payer: Self-pay | Admitting: Psychiatry

## 2013-06-27 LAB — GLUCOSE, CAPILLARY: Glucose-Capillary: 134 mg/dL — ABNORMAL HIGH (ref 70–99)

## 2013-06-27 MED ORDER — ESCITALOPRAM OXALATE 10 MG PO TABS
5.0000 mg | ORAL_TABLET | Freq: Every day | ORAL | Status: DC
  Administered 2013-06-27 – 2013-06-28 (×2): 5 mg via ORAL
  Filled 2013-06-27 (×2): qty 1

## 2013-06-27 MED ORDER — VALPROIC ACID 250 MG PO CAPS
1000.0000 mg | ORAL_CAPSULE | Freq: Once | ORAL | Status: AC
  Administered 2013-06-27: 1000 mg via ORAL
  Filled 2013-06-27: qty 4

## 2013-06-27 NOTE — BH Assessment (Signed)
[facility], referral packet faxed. Patient is awaiting acceptance to the wait list.
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
## 2013-06-27 NOTE — Progress Notes (Signed)
   CARE MANAGEMENT ED NOTE 06/27/2013  Patient:  Jonathon Lane,Jonathon Lane   Account Number:  [id number]  Date Initiated:  06/27/2013  Documentation initiated by:  Edd ArbourGIBBS,Kaylem Gidney  Subjective/Objective Assessment:   80 yr old medicare pt from [facility] facility with aggressive behavior wife POA Pending [facility]     Subjective/Objective Assessment Detail:   Recommendations from [facility]: start on Namenda, Increase seroquel to 50 mg bid then to 100 mg, wean off effexor ("stimulator"), start on Lexapro 10 mg & continue other BH medications (depakote, haldol)     Action/Plan:   Pt discussed in [facility]  CM spoke with Dr Sylvester Hardertaylor BH MD & Dr Silverio Layyao EDP about recommendations   Action/Plan Detail:   Anticipated DC Date:       Status Recommendation to Physician:   Result of Recommendation:    Other ED Services  Consult Working Plan    DC Planning Services  Other  Outpatient Services - Pt will follow up    Choice offered to / List presented to:            Status of service:  Completed, signed off  ED Comments:   ED Comments Detail:

## 2013-06-27 NOTE — Progress Notes (Signed)
[facility] to [facility], spoke with Noreene LarssonJill who took pt's demographic information, stated the clinician would not get in until 0730 and that time would give authorization number.  Application and information passed on to oncoming shift.   Tomi BambergerMariya Doye Montilla Disposition MHT

## 2013-06-27 NOTE — Progress Notes (Signed)
ED CM placed guardianship information on pt's ED chart for [phone] and provided a copy to ED SW

## 2013-06-27 NOTE — ED Notes (Signed)
Call family member at [phone].  Leave voicemail if no answer, family member will call back.

## 2013-06-27 NOTE — Progress Notes (Signed)
Writer contacted [facility] and was informed that the social worker is reviewing the referral and will call back.

## 2013-06-27 NOTE — ED Notes (Signed)
Patient sleeping deeply, pt left asleep until ready to awaken and eat. Medication will be administered at such time as patient awakens to eat, patient not awakened due to violent nature.

## 2013-06-27 NOTE — Consult Note (Signed)
  Review of Systems  Constitutional: Negative.   HENT: Negative.   Eyes: Negative.   Respiratory: Negative.   Cardiovascular: Negative.   Gastrointestinal: Negative.   Genitourinary: Negative.   Musculoskeletal: Negative.   Skin: Negative.   Neurological: Negative.   Endo/Heme/Allergies: Negative.   Psychiatric/Behavioral: Positive for memory loss.

## 2013-06-27 NOTE — ED Notes (Signed)
Assumed care of patient Patient resting in position of comfort with eyes closed RR WNL--even and unlabored with equal rise and fall of chest Patient in NAD Side rails up, call bell in reach  

## 2013-06-27 NOTE — Progress Notes (Signed)
Pt also has medicaid listed as well, per [facility]. Pt medicaid number is  [id number] L. CSW will update registration.    Olga CoasterKristen Yoshino Bro[place], [place]LCSW [phone]  ED CSW .06/28/2013 1606pm

## 2013-06-28 ENCOUNTER — Encounter (HOSPITAL_COMMUNITY): Payer: Self-pay | Admitting: Registered Nurse

## 2013-06-28 MED ORDER — HALOPERIDOL 0.5 MG PO TABS: 2.5000 mg | ORAL_TABLET | ORAL | Status: AC

## 2013-06-28 MED ORDER — ESCITALOPRAM OXALATE 5 MG PO TABS: 5.0000 mg | ORAL_TABLET | Freq: Every day | ORAL | Status: AC

## 2013-06-28 MED ORDER — POLYETHYLENE GLYCOL 3350 17 G PO PACK: 17.0000 g | PACK | Freq: Every day | ORAL | Status: AC

## 2013-06-28 MED ORDER — TAMSULOSIN HCL 0.4 MG PO CAPS: 0.4000 mg | ORAL_CAPSULE | Freq: Every evening | ORAL | Status: AC

## 2013-06-28 MED ORDER — ZOLPIDEM TARTRATE 5 MG PO TABS: 5.0000 mg | ORAL_TABLET | Freq: Every evening | ORAL | Status: AC | PRN

## 2013-06-28 MED ORDER — CLONAZEPAM 0.5 MG PO TABS: 0.2500 mg | ORAL_TABLET | Freq: Three times a day (TID) | ORAL | Status: AC

## 2013-06-28 MED ORDER — CYANOCOBALAMIN 1000 MCG PO TABS: 1000.0000 ug | ORAL_TABLET | Freq: Every day | ORAL | Status: AC

## 2013-06-28 MED ORDER — SACCHAROMYCES BOULARDII 250 MG PO CAPS: 250.0000 mg | ORAL_CAPSULE | Freq: Two times a day (BID) | ORAL | Status: AC

## 2013-06-28 MED ORDER — QUETIAPINE FUMARATE 50 MG PO TABS: 50.0000 mg | ORAL_TABLET | Freq: Every day | ORAL | Status: AC

## 2013-06-28 MED ORDER — INSULIN DETEMIR 100 UNIT/ML ~~LOC~~ SOLN: 20.0000 [IU] | Freq: Every day | SUBCUTANEOUS | Status: AC

## 2013-06-28 MED ORDER — QUETIAPINE FUMARATE 100 MG PO TABS: 100.0000 mg | ORAL_TABLET | Freq: Every day | ORAL | Status: AC

## 2013-06-28 MED ORDER — QUETIAPINE FUMARATE 200 MG PO TABS: 200.0000 mg | ORAL_TABLET | Freq: Every day | ORAL | Status: AC

## 2013-06-28 MED ORDER — HALOPERIDOL 10 MG PO TABS: 10.0000 mg | ORAL_TABLET | Freq: Every day | ORAL | Status: AC

## 2013-06-28 NOTE — B[facility] Counselor (Addendum)
2:30 pm. Prior IVC paperwork no longer in pt's chart where it was placed yesterday 1/21. Current RN hadn't seen paperwork. Writer created new IVC paperwork and faxed to [employer] [place] who confirmed receipt of fax.  Once IVC served, oncoming TTS or CSW will need to fax to Darlene at [facility] for review along w/ guardian paperwork.   Evette Cristalaroline Paige Donicia Druck, [place] Assessment Counselor   Left voicemail for pt's wife and guardian, Zettie Phoatty Salais to notify her pt has been accepted to [facility] and that wife would need to sign papers at [facility] although that could be done over phone per Agustin Creearlene at [facility].    Darlene requested copy of IVC. Writer will fax copy of IVC and guardianship papers to Darlene at [phone].  Evette Cristalaroline Paige Geneve Kimpel, [place] Assessment Counselor

## 2013-06-28 NOTE — Progress Notes (Signed)
[facility] referred [name], with available geripsych beds pending review.  [name] who stated nurse requested PTT and INR on patient. CSW informed Psychiatrist and NP for order.  CSW also left message for [name] [facility] regarding referral that was pending reivew.    Jonathon Lane.Jonathon Bagent, LCSW [phone] ED CSW .06/28/2013 11:00am

## 2013-06-28 NOTE — Progress Notes (Addendum)
CSW attempted to inform patient spouse/guardian of [facility] admission [facility]. CSW called home number and left message as well as cell phone [phone].   Byrd Hesselbach.Shelby Peltz, LCSW [phone]  ED CSW .06/28/2013 1605pm   CSW spoke with pt wife, who is aware of pt transfer to [facility]. CSW provided pt spouse with [facility] station at [facility] [phone].   Olga CoasterKristen Osa Fogarty, [place] [phone]  ED CSW .06/28/2013 1622pm

## 2013-06-28 NOTE — BH Assessment (Addendum)
Spoke with Tech Claudius SisAngelo Terry at [facility] to complete telephone referral. He confirmed that referral information received   And a nurse will be contacting TTS regarding [facility] waitlist status.   Glorious PeachNajah Salik Grewell, MS, LCASA Assessment Counselor

## 2013-06-28 NOTE — ED Notes (Signed)
Sheriff's department called for transport to [facility]

## 2013-06-28 NOTE — Consult Note (Signed)
[facility] Face-to-Face Psychiatry Consult   Reason for Consult:  Agitation, dementia Referring Physician:  ED MD Jonathon Lane is an 80 y.o. male.  Assessment: AXIS I:  Adjustment Disorder with Disturbance of Conduct AXIS II:  Deferred AXIS III:   Past Medical History  Diagnosis Date  . Hypertension   . Diabetes mellitus without complication   . Dementia   . BPH (benign prostatic hyperplasia)   . Depression    AXIS IV:  housing problems, other psychosocial or environmental problems, problems related to social environment and problems with primary support group AXIS V:  41-50 serious symptoms  Plan:  Recommend psychiatric Inpatient admission when medically cleared.  Subjective:   Jonathon Lane is a 80 y.o. male patient admitted with agitation and threat to others and self.  HPI:   Patient remains in the ED related to dementia and aggressive behavior.  Patient has been calm and states that he slept well last night.  Patient appears to be in a pleasant mood.  "I slept good.  Yes eating okay.  I just want to know where my wife is.  She has got a 48 week old baby."  When asked if it was his wife's child or grandchild; patient smiled and responded pointing to him self "It's my what do you think; I'm just 80 yrs old; I still going up."  Patient does know his name and age but has some confusion about place.  States that the date is December 31 st.    Patient has been accepted at [facility].  Will continue to monitor for safety and stabilization until patient has transferred.    HPI Elements:   Location:  generalized. Quality:  chronic. Severity:  ssevere. Timing:  constant. Duration:  few weeks. Context:  stressors.  Past Psychiatric History: Past Medical History  Diagnosis Date  . Hypertension   . Diabetes mellitus without complication   . Dementia   . BPH (benign prostatic hyperplasia)   . Depression     reports that he quit smoking about 32 years ago. He does not have any  smokeless tobacco history on file. His alcohol and drug histories are not on file. History reviewed. No pertinent family history.   Living Arrangements: Other (Comment) (lives at [facility]) Can pt return to current living arrangement?: No (not without getting inpatient geripsych)   Allergies:  No Known Allergies  ACT Assessment Complete:  Yes:    Educational Status    Risk to Self: Risk to self Is patient at risk for suicide?: No Substance abuse history and/or treatment for substance abuse?: No  Risk to Others: Risk to Others History of harm to others?: Yes (combativeness at previous ALFs) Violent Behavior Description:  (combative. throwing chairs, choking staff at ALF.) Does patient have access to weapons?: No Criminal Charges Pending?: No  Abuse:    Prior Inpatient Therapy: Prior Inpatient Therapy Prior Inpatient Therapy: Yes Prior Therapy Dates:  (most recently d/c from [facility] on 1/17) Prior Therapy Facilty/Provider(s):  ([place], [facility], [place]) Reason for Treatment: combativeness, dementia NOS  Prior Outpatient Therapy:    Additional Information: Additional Information CIRT Risk: Yes Elopement Risk: Yes   Objective: Blood pressure 117/76, pulse 70, temperature 97.4 F (36.3 C), temperature source Oral, resp. rate 16, SpO2 98.00%.There is no weight on file to calculate BMI. Results for orders placed during the hospital encounter of 06/23/13 (from the past 72 hour(s))  GLUCOSE, CAPILLARY     Status: Abnormal   Collection Time  06/27/13 11:20 AM      Result Value Range   Glucose-Capillary 134 (*) 70 - 99 mg/dL   Labs are reviewed and are pertinent for no medical issues. Medications reviewed  No modifications or additions.  Will continue with current medications Current Facility-Administered Medications  Medication Dose Route Frequency Provider Last Rate Last Dose  . aspirin chewable tablet 81 mg  81 mg Oral QHS Raeford RazorStephen Kohut, MD   81 mg at 06/27/13  2202  . clonazePAM (KLONOPIN) tablet 0.25 mg  0.25 mg Oral TID Raeford RazorStephen Kohut, MD   0.25 mg at 06/28/13 1036  . escitalopram (LEXAPRO) tablet 5 mg  5 mg Oral Daily Nanine MeansJamison Lord, NP   5 mg at 06/28/13 1037  . furosemide (LASIX) tablet 20 mg  20 mg Oral q morning - 10a Raeford RazorStephen Kohut, MD   20 mg at 06/28/13 1037  . glimepiride (AMARYL) tablet 4 mg  4 mg Oral Q breakfast Raeford RazorStephen Kohut, MD   4 mg at 06/28/13 0851  . haloperidol (HALDOL) tablet 10 mg  10 mg Oral QHS Raeford RazorStephen Kohut, MD   10 mg at 06/27/13 2201  . haloperidol (HALDOL) tablet 2.5 mg  2.5 mg Oral Q24H Raeford RazorStephen Kohut, MD   2.5 mg at 06/27/13 1706  . HYDROcodone-acetaminophen (NORCO/VICODIN) 5-325 MG per tablet 1 tablet  1 tablet Oral Q6H PRN Raeford RazorStephen Kohut, MD      . insulin aspart (novoLOG) injection 8 Units  8 Units Subcutaneous BID PRN Raeford RazorStephen Kohut, MD      . insulin detemir (LEVEMIR) injection 20 Units  20 Units Subcutaneous Daily Raeford RazorStephen Kohut, MD   20 Units at 06/28/13 1101  . metFORMIN (GLUCOPHAGE) tablet 1,000 mg  1,000 mg Oral BID WC Raeford RazorStephen Kohut, MD   1,000 mg at 06/28/13 0850  . metoprolol succinate (TOPROL-XL) 24 hr tablet 25 mg  25 mg Oral q morning - 10a Raeford RazorStephen Kohut, MD   25 mg at 06/28/13 1037  . polyethylene glycol (MIRALAX / GLYCOLAX) packet 17 g  17 g Oral Daily Raeford RazorStephen Kohut, MD   17 g at 06/28/13 1036  . QUEtiapine (SEROQUEL) tablet 100 mg  100 mg Oral Daily Raeford RazorStephen Kohut, MD   100 mg at 06/28/13 1037  . QUEtiapine (SEROQUEL) tablet 200 mg  200 mg Oral QHS Raeford RazorStephen Kohut, MD   200 mg at 06/27/13 2201  . QUEtiapine (SEROQUEL) tablet 50 mg  50 mg Oral Daily Raeford RazorStephen Kohut, MD   50 mg at 06/28/13 1039  . saccharomyces boulardii (FLORASTOR) capsule 250 mg  250 mg Oral BID Raeford RazorStephen Kohut, MD   250 mg at 06/28/13 1036  . tamsulosin (FLOMAX) capsule 0.4 mg  0.4 mg Oral QPM Raeford RazorStephen Kohut, MD   0.4 mg at 06/27/13 1706  . venlafaxine (EFFEXOR) tablet 75 mg  75 mg Oral QHS Raeford RazorStephen Kohut, MD   75 mg at 06/27/13 2202  . vitamin B-12  (CYANOCOBALAMIN) tablet 1,000 mcg  1,000 mcg Oral Daily Raeford RazorStephen Kohut, MD   1,000 mcg at 06/28/13 1036  . vitamin C (ASCORBIC ACID) tablet 500 mg  500 mg Oral BID Raeford RazorStephen Kohut, MD   500 mg at 06/28/13 1038  . zolpidem (AMBIEN) tablet 5 mg  5 mg Oral QHS PRN Raeford RazorStephen Kohut, MD       Current Outpatient Prescriptions  Medication Sig Dispense Refill  . aspirin 81 MG chewable tablet Chew 81 mg by mouth at bedtime.      . clonazePAM (KLONOPIN) 0.5 MG tablet Take 0.25 mg by  mouth 3 (three) times daily.      . divalproex (DEPAKOTE ER) 500 MG 24 hr tablet Take 1,000 mg by mouth every evening.      . furosemide (LASIX) 20 MG tablet Take 20 mg by mouth every morning.       Marland Kitchen glimepiride (AMARYL) 4 MG tablet Take 4 mg by mouth daily with breakfast.      . haloperidol (HALDOL) 2 MG/ML solution Take 2.5-10 mg by mouth 2 (two) times daily. Give 2.5mg  at 5pm and 10mg  at bedtime      . HYDROcodone-acetaminophen (NORCO/VICODIN) 5-325 MG per tablet Take 1 tablet by mouth every 6 (six) hours as needed for moderate pain.      Marland Kitchen insulin aspart (NOVOLOG) 100 UNIT/ML injection Inject 8 Units into the skin 2 (two) times daily as needed for high blood sugar (over 350 after patient eats a meal).      . insulin detemir (LEVEMIR) 100 UNIT/ML injection Inject 20 Units into the skin every morning. Hold if CBG < 120.      . metFORMIN (GLUCOPHAGE) 500 MG tablet Take 1,000 mg by mouth 2 (two) times daily with a meal.      . metoprolol succinate (TOPROL-XL) 25 MG 24 hr tablet Take 25 mg by mouth every morning.       Marland Kitchen PRESCRIPTION MEDICATION Apply 1 mg topically every 6 (six) hours as needed (agitation). *max 2mg  in 24 hours* Lorazepam topical gel-apply to inner wrist every 6 hours as needed for agitation      . Tamsulosin HCl (FLOMAX) 0.4 MG CAPS Take 0.4 mg by mouth every evening.       . venlafaxine (EFFEXOR) 37.5 MG tablet Take 37.5 mg by mouth every evening.      . venlafaxine (EFFEXOR) 75 MG tablet Take 75 mg by mouth at  bedtime.        Psychiatric Specialty Exam:     Blood pressure 117/76, pulse 70, temperature 97.4 F (36.3 C), temperature source Oral, resp. rate 16, SpO2 98.00%.There is no weight on file to calculate BMI.  General Appearance: Disheveled  Eye Solicitor::  Fair  Speech:  Slow  Volume:  Normal  Mood:  Euthymic  Affect:  Congruent  Thought Process:  Disorganized and Irrelevant  Orientation:  Other:  alert, oriented to self  Thought Content:  WDL  Suicidal Thoughts:  No  Homicidal Thoughts:  No  Memory:  Immediate;   Poor Recent;   Poor Remote;   Poor  Judgement:  Poor  Insight:  Lacking  Psychomotor Activity:  Decreased  Concentration:  Fair  Recall:  Poor  Akathisia:  No  Handed:  Right  AIMS (if indicated):     Assets:  Resilience  Sleep:      Treatment Plan Summary: Daily contact with patient to assess and evaluate symptoms and progress in treatment Medication management--Effexor 37.5 discontinued and will cut 75 mg in half tomorrow evening.  Lexapro 5 mg started for depression.    Disposition:  Patient accepted to [facility].  Transfer to [facility].  Continue with current plan for inpatient treatment; monitor for safety and stabilization until patient is transferred to [facility].  Rankin, Shuvon, FNP-BC 06/28/2013 1:16 PM

## 2013-06-29 NOTE — Consult Note (Signed)
Patient personally evaluated and agreed with 

## 2013-06-29 NOTE — Consult Note (Signed)
Patient personally evaluated, note reviewed and agreed with 

## 2013-06-29 NOTE — Consult Note (Signed)
Face to face evaluation and not reviewed and agreed with
